# Patient Record
Sex: Male | Born: 1980 | Race: Black or African American | Hispanic: No | Marital: Single | Smoking: Light tobacco smoker
Health system: Southern US, Community
[De-identification: ages and names within clinical notes are randomized; demographics above are authoritative.]

## PROBLEM LIST (undated history)

## (undated) DIAGNOSIS — I10 Essential (primary) hypertension: Secondary | ICD-10-CM

## (undated) DIAGNOSIS — I4891 Unspecified atrial fibrillation: Secondary | ICD-10-CM

## (undated) HISTORY — DX: Essential (primary) hypertension: I10

## (undated) HISTORY — PX: CARDIAC SURGERY: SHX584

---

## 2011-09-17 ENCOUNTER — Emergency Department (HOSPITAL_COMMUNITY): Payer: No Typology Code available for payment source

## 2011-09-17 ENCOUNTER — Emergency Department (HOSPITAL_COMMUNITY)
Admission: EM | Admit: 2011-09-17 | Discharge: 2011-09-17 | Disposition: A | Discharge status: Home / Self Care | Payer: No Typology Code available for payment source | Attending: Emergency Medicine | Admitting: Emergency Medicine

## 2011-09-17 ENCOUNTER — Encounter (HOSPITAL_COMMUNITY): Payer: Self-pay | Admitting: *Deleted

## 2011-09-17 DIAGNOSIS — M545 Low back pain, unspecified: Secondary | ICD-10-CM | POA: Insufficient documentation

## 2011-09-17 DIAGNOSIS — S46909A Unspecified injury of unspecified muscle, fascia and tendon at shoulder and upper arm level, unspecified arm, initial encounter: Secondary | ICD-10-CM | POA: Insufficient documentation

## 2011-09-17 DIAGNOSIS — S4980XA Other specified injuries of shoulder and upper arm, unspecified arm, initial encounter: Secondary | ICD-10-CM | POA: Insufficient documentation

## 2011-09-17 DIAGNOSIS — J45909 Unspecified asthma, uncomplicated: Secondary | ICD-10-CM | POA: Insufficient documentation

## 2011-09-17 DIAGNOSIS — M546 Pain in thoracic spine: Secondary | ICD-10-CM | POA: Insufficient documentation

## 2011-09-17 DIAGNOSIS — J343 Hypertrophy of nasal turbinates: Secondary | ICD-10-CM | POA: Insufficient documentation

## 2011-09-17 DIAGNOSIS — M25519 Pain in unspecified shoulder: Secondary | ICD-10-CM | POA: Insufficient documentation

## 2011-09-17 MED ORDER — IBUPROFEN 600 MG PO TABS: 600.0000 mg | ORAL_TABLET | Freq: Four times a day (QID) | ORAL | Status: AC | PRN

## 2011-09-17 MED ORDER — CYCLOBENZAPRINE HCL 10 MG PO TABS: 10.0000 mg | ORAL_TABLET | Freq: Two times a day (BID) | ORAL | Status: AC | PRN

## 2011-09-17 MED ORDER — OXYCODONE-ACETAMINOPHEN 5-325 MG PO TABS
2.0000 | ORAL_TABLET | Freq: Once | ORAL | Status: AC
  Administered 2011-09-17: 2 via ORAL
  Filled 2011-09-17: qty 2

## 2011-09-17 MED ORDER — TRAMADOL HCL 50 MG PO TABS: 50.0000 mg | ORAL_TABLET | Freq: Four times a day (QID) | ORAL | Status: AC | PRN
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
## 2011-09-17 NOTE — ED Notes (Signed)
No answer
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
## 2011-09-18 NOTE — ED Provider Notes (Signed)
Medical screening examination/treatment/procedure(s) were performed by non-physician practitioner and as supervising physician I was immediately available for consultation/collaboration. Christabelle Hanzlik Y.   Gavin Pound. Oletta Lamas, MD 09/18/11 [phone]

## 2013-02-23 ENCOUNTER — Emergency Department (HOSPITAL_BASED_OUTPATIENT_CLINIC_OR_DEPARTMENT_OTHER)
Admission: EM | Admit: 2013-02-23 | Discharge: 2013-02-23 | Disposition: A | Discharge status: Home / Self Care | Payer: Self-pay | Attending: Emergency Medicine | Admitting: Emergency Medicine

## 2013-02-23 ENCOUNTER — Encounter (HOSPITAL_BASED_OUTPATIENT_CLINIC_OR_DEPARTMENT_OTHER): Payer: Self-pay | Admitting: *Deleted

## 2013-02-23 DIAGNOSIS — S39012A Strain of muscle, fascia and tendon of lower back, initial encounter: Secondary | ICD-10-CM

## 2013-02-23 DIAGNOSIS — IMO0002 Reserved for concepts with insufficient information to code with codable children: Secondary | ICD-10-CM | POA: Insufficient documentation

## 2013-02-23 DIAGNOSIS — Y9367 Activity, basketball: Secondary | ICD-10-CM | POA: Insufficient documentation

## 2013-02-23 DIAGNOSIS — J45909 Unspecified asthma, uncomplicated: Secondary | ICD-10-CM | POA: Insufficient documentation

## 2013-02-23 DIAGNOSIS — I10 Essential (primary) hypertension: Secondary | ICD-10-CM | POA: Insufficient documentation

## 2013-02-23 DIAGNOSIS — S335XXA Sprain of ligaments of lumbar spine, initial encounter: Secondary | ICD-10-CM | POA: Insufficient documentation

## 2013-02-23 DIAGNOSIS — Y9289 Other specified places as the place of occurrence of the external cause: Secondary | ICD-10-CM | POA: Insufficient documentation

## 2013-02-23 DIAGNOSIS — F17200 Nicotine dependence, unspecified, uncomplicated: Secondary | ICD-10-CM | POA: Insufficient documentation

## 2013-02-23 HISTORY — DX: Essential (primary) hypertension: I10

## 2013-02-23 MED ORDER — OXYCODONE-ACETAMINOPHEN 5-325 MG PO TABS
1.0000 | ORAL_TABLET | Freq: Four times a day (QID) | ORAL | Status: DC | PRN
Start: 2013-02-23 — End: 2017-05-02

## 2013-02-23 MED ORDER — IBUPROFEN 600 MG PO TABS: 600.0000 mg | ORAL_TABLET | Freq: Four times a day (QID) | ORAL | Status: DC | PRN

## 2013-02-23 MED ORDER — HYDROMORPHONE HCL PF 1 MG/ML IJ SOLN
1.0000 mg | Freq: Once | INTRAMUSCULAR | Status: AC
  Administered 2013-02-23: 1 mg via INTRAVENOUS
  Filled 2013-02-23: qty 1

## 2013-02-23 MED ORDER — DIAZEPAM 5 MG PO TABS: 5.0000 mg | ORAL_TABLET | Freq: Four times a day (QID) | ORAL | Status: DC | PRN

## 2013-02-23 MED ORDER — DIAZEPAM 5 MG/ML IJ SOLN
5.0000 mg | Freq: Once | INTRAMUSCULAR | Status: AC
  Administered 2013-02-23: 14:00:00 via INTRAVENOUS
  Filled 2013-02-23: qty 2

## 2013-02-23 NOTE — ED Notes (Signed)
Per EMS:  Pt from home.  Reports that he was playing basketball yesterday and twisted wrong.  Reports left lower back pain.  Denies fall.  20 LAC, 30mg  ketorlac given en route.

## 2013-02-23 NOTE — ED Notes (Signed)
Patient ambulates around RN station without difficulty. 

## 2013-02-23 NOTE — ED Provider Notes (Signed)
History    CSN: [id number] Arrival date & time 02/23/13  1041  First MD Initiated Contact with Patient 02/23/13 1126     Chief Complaint  Patient presents with  . Back Pain   (Consider location/radiation/quality/duration/timing/severity/associated sxs/prior Treatment) Patient is a 32 y.o. male presenting with back pain. The history is provided by the patient.  Back Pain Associated symptoms: no abdominal pain, no chest pain, no headaches, no numbness and no weakness    patient was playing basketball yesterday had acute onset of lower back pain. He was able lower himself to the ground. No trauma from a fall. He has had persistent lower back pain since. No weakness. Pain unrelieved with Motrin at home. No numbness weakness. No dysuria. No loss of bladder or bowel control. He does not have a history of back pain. Past Medical History  Diagnosis Date  . Asthma   . Hypertension    History reviewed. No pertinent past surgical history. History reviewed. No pertinent family history. History  Substance Use Topics  . Smoking status: Current Every Day Smoker -- 1.00 packs/day    Types: Cigarettes  . Smokeless tobacco: Not on file  . Alcohol Use: No    Review of Systems  Constitutional: Negative for activity change and appetite change.  HENT: Negative for neck stiffness.   Respiratory: Negative for chest tightness and shortness of breath.   Cardiovascular: Negative for chest pain and leg swelling.  Gastrointestinal: Negative for nausea, vomiting, abdominal pain and diarrhea.  Endocrine: Negative for polydipsia and polyuria.  Genitourinary: Negative for flank pain.  Musculoskeletal: Positive for back pain. Negative for joint swelling.  Skin: Negative for rash.  Neurological: Negative for weakness, numbness and headaches.    Allergies  Review of patient's allergies indicates no known allergies.  Home Medications   Current Outpatient Rx  Name  Route  Sig  Dispense  Refill  .  diazepam (VALIUM) 5 MG tablet   Oral   Take 1 tablet (5 mg total) by mouth every 6 (six) hours as needed for anxiety.   10 tablet   0   . ibuprofen (ADVIL,MOTRIN) 600 MG tablet   Oral   Take 1 tablet (600 mg total) by mouth every 6 (six) hours as needed for pain.   30 tablet   0   . lisinopril (PRINIVIL,ZESTRIL) 5 MG tablet   Oral   Take 5 mg by mouth daily.         Marland Kitchen oxyCODONE-acetaminophen (PERCOCET/ROXICET) 5-325 MG per tablet   Oral   Take 1-2 tablets by mouth every 6 (six) hours as needed for pain.   20 tablet   0    BP 127/64  Pulse 60  Temp(Src) 98.2 F (36.8 C) (Oral)  Resp 20  Ht 6\' 4"  (1.93 m)  Wt 260 lb (117.935 kg)  BMI 31.66 kg/m2  SpO2 100% Physical Exam  Nursing note and vitals reviewed. Constitutional: He is oriented to person, place, and time. He appears well-developed and well-nourished.  Patient is lying in bed and appears uncomfortable  HENT:  Head: Normocephalic and atraumatic.  Eyes: EOM are normal. Pupils are equal, round, and reactive to light.  Neck: Normal range of motion. Neck supple.  Cardiovascular: Normal rate, regular rhythm and normal heart sounds.   No murmur heard. Pulmonary/Chest: Effort normal and breath sounds normal.  Abdominal: Soft. Bowel sounds are normal. He exhibits no distension and no mass. There is no tenderness. There is no rebound and no guarding.  Musculoskeletal:  He exhibits tenderness. He exhibits no edema.  Left lumbar paraspinal tenderness. No midline tenderness. No rash.  Neurological: He is alert and oriented to person, place, and time. No cranial nerve deficit.  Skin: Skin is warm and dry.  Psychiatric: He has a normal mood and affect.    ED Course  Procedures (including critical care time) Labs Reviewed - No data to display No results found. 1. Lumbar strain, initial encounter     MDM  Patient with back pain. Not radicular sounding. Patient feels better after treatment will be discharged home. No red  flags.  Juliet Rude. Rubin Payor, MD 02/23/13 [phone]

## 2013-02-23 NOTE — ED Notes (Signed)
Pt sleeping with deep, snoring resp. Pt aroused by his gf at bedside, states "wake up, your pain medicine is here!" pt arouses, speech slurred, states "good. About time." and goes back to sleep with deep snoring resp.

## 2014-07-07 ENCOUNTER — Emergency Department (HOSPITAL_COMMUNITY)
Admission: EM | Admit: 2014-07-07 | Discharge: 2014-07-07 | Disposition: A | Discharge status: Home / Self Care | Payer: No Typology Code available for payment source | Attending: Emergency Medicine | Admitting: Emergency Medicine

## 2014-07-07 ENCOUNTER — Encounter (HOSPITAL_COMMUNITY): Payer: Self-pay | Admitting: Emergency Medicine

## 2014-07-07 ENCOUNTER — Emergency Department (HOSPITAL_COMMUNITY): Payer: No Typology Code available for payment source

## 2014-07-07 DIAGNOSIS — R079 Chest pain, unspecified: Secondary | ICD-10-CM

## 2014-07-07 DIAGNOSIS — J189 Pneumonia, unspecified organism: Secondary | ICD-10-CM

## 2014-07-07 DIAGNOSIS — Z79899 Other long term (current) drug therapy: Secondary | ICD-10-CM | POA: Insufficient documentation

## 2014-07-07 DIAGNOSIS — J159 Unspecified bacterial pneumonia: Secondary | ICD-10-CM | POA: Insufficient documentation

## 2014-07-07 DIAGNOSIS — H6502 Acute serous otitis media, left ear: Secondary | ICD-10-CM

## 2014-07-07 DIAGNOSIS — Z720 Tobacco use: Secondary | ICD-10-CM | POA: Insufficient documentation

## 2014-07-07 DIAGNOSIS — J45909 Unspecified asthma, uncomplicated: Secondary | ICD-10-CM | POA: Insufficient documentation

## 2014-07-07 DIAGNOSIS — I4891 Unspecified atrial fibrillation: Secondary | ICD-10-CM | POA: Insufficient documentation

## 2014-07-07 DIAGNOSIS — I10 Essential (primary) hypertension: Secondary | ICD-10-CM | POA: Insufficient documentation

## 2014-07-07 HISTORY — DX: Unspecified atrial fibrillation: I48.91

## 2014-07-07 LAB — CBC
HCT: 44.2 % (ref 39.0–52.0)
Hemoglobin: 15.3 g/dL (ref 13.0–17.0)
MCH: 28.6 pg (ref 26.0–34.0)
MCHC: 34.6 g/dL (ref 30.0–36.0)
MCV: 82.6 fL (ref 78.0–100.0)
PLATELETS: 289 10*3/uL (ref 150–400)
RBC: 5.35 MIL/uL (ref 4.22–5.81)
RDW: 13.8 % (ref 11.5–15.5)
WBC: 10.3 10*3/uL (ref 4.0–10.5)

## 2014-07-07 LAB — I-STAT TROPONIN, ED: Troponin i, poc: 0 ng/mL (ref 0.00–0.08)

## 2014-07-07 LAB — BASIC METABOLIC PANEL
ANION GAP: 19 — AB (ref 5–15)
BUN: 8 mg/dL (ref 6–23)
CALCIUM: 9.8 mg/dL (ref 8.4–10.5)
CHLORIDE: 92 meq/L — AB (ref 96–112)
CO2: 24 mEq/L (ref 19–32)
CREATININE: 1.12 mg/dL (ref 0.50–1.35)
GFR calc non Af Amer: 86 mL/min — ABNORMAL LOW (ref >90)
Glucose, Bld: 91 mg/dL (ref 70–99)
Potassium: 3.5 mEq/L — ABNORMAL LOW (ref 3.7–5.3)
Sodium: 135 mEq/L — ABNORMAL LOW (ref 137–147)

## 2014-07-07 LAB — PRO B NATRIURETIC PEPTIDE: Pro B Natriuretic peptide (BNP): <5 pg/mL (ref 0–125)

## 2014-07-07 MED ORDER — AMOXICILLIN-POT CLAVULANATE 875-125 MG PO TABS
1.0000 | ORAL_TABLET | Freq: Once | ORAL | Status: AC
  Administered 2014-07-07: 1 via ORAL
  Filled 2014-07-07: qty 1

## 2014-07-07 MED ORDER — AMOXICILLIN-POT CLAVULANATE 875-125 MG PO TABS: 1.0000 | ORAL_TABLET | Freq: Once | ORAL | Status: DC

## 2014-07-07 MED ORDER — ACETAMINOPHEN 325 MG PO TABS
325.0000 mg | ORAL_TABLET | Freq: Once | ORAL | Status: AC
  Administered 2014-07-07: 325 mg via ORAL
  Filled 2014-07-07: qty 1

## 2014-07-07 MED ORDER — AZITHROMYCIN 250 MG PO TABS: 250.0000 mg | ORAL_TABLET | Freq: Every day | ORAL | Status: DC

## 2014-07-07 MED ORDER — AZITHROMYCIN 250 MG PO TABS
500.0000 mg | ORAL_TABLET | Freq: Once | ORAL | Status: AC
  Administered 2014-07-07: 500 mg via ORAL
  Filled 2014-07-07: qty 2

## 2014-07-07 NOTE — ED Notes (Signed)
Pt. reports mid chest pain with SOB onset 4 days ago , denies nausea or diaphoresis .

## 2014-07-07 NOTE — ED Provider Notes (Signed)
CSN: [id number]     Arrival date & time 07/07/14  0144 History   First MD Initiated Contact with Patient 07/07/14 0207     Chief Complaint  Patient presents with  . Chest Pain     (Consider location/radiation/quality/duration/timing/severity/associated sxs/prior Treatment) HPI 33 year old male presents to the emergency department with complaint of shortness of breath, cough, chest pain, body aches, left ear pain.  Patient is a smoker.  Chest pain is diffuse, slightly worse on the right side.  It is worse with taking a deep breath. Past Medical History  Diagnosis Date  . Asthma   . Hypertension   . Atrial fibrillation    History reviewed. No pertinent past surgical history. No family history on file. History  Substance Use Topics  . Smoking status: Current Every Day Smoker -- 1.00 packs/day    Types: Cigarettes  . Smokeless tobacco: Not on file  . Alcohol Use: No    Review of Systems   See History of Present Illness; otherwise all other systems are reviewed and negative  Allergies  Review of patient's allergies indicates no known allergies.  Home Medications   Prior to Admission medications   Medication Sig Start Date End Date Taking? Authorizing Provider  diazepam (VALIUM) 5 MG tablet Take 1 tablet (5 mg total) by mouth every 6 (six) hours as needed for anxiety. 02/23/13   Juliet RudeNathan R. Pickering, MD  ibuprofen (ADVIL,MOTRIN) 600 MG tablet Take 1 tablet (600 mg total) by mouth every 6 (six) hours as needed for pain. 02/23/13   Juliet RudeNathan R. Pickering, MD  lisinopril (PRINIVIL,ZESTRIL) 5 MG tablet Take 5 mg by mouth daily.    Historical Provider, MD  oxyCODONE-acetaminophen (PERCOCET/ROXICET) 5-325 MG per tablet Take 1-2 tablets by mouth every 6 (six) hours as needed for pain. 02/23/13   Juliet RudeNathan R. Pickering, MD   BP 139/73 mmHg  Pulse 93  Temp(Src) 102.8 F (39.3 C) (Oral)  Resp 12  SpO2 99% Physical Exam  Constitutional: He is oriented to person, place, and time. He appears  well-developed and well-nourished.  HENT:  Head: Normocephalic and atraumatic.  Right Ear: External ear normal.  Nose: Nose normal.  Mouth/Throat: Oropharynx is clear and moist.  Patient has effusion noted behind left TM  Eyes: Conjunctivae and EOM are normal. Pupils are equal, round, and reactive to light.  Neck: Normal range of motion. Neck supple. No JVD present. No tracheal deviation present. No thyromegaly present.  Cardiovascular: Normal rate, regular rhythm, normal heart sounds and intact distal pulses.  Exam reveals no gallop and no friction rub.   No murmur heard. Pulmonary/Chest: Effort normal. No stridor. No respiratory distress. He has no wheezes. He has no rales. He exhibits no tenderness.  Mild rhonchi in right lower lung fields  Abdominal: Soft. Bowel sounds are normal. He exhibits no distension and no mass. There is no tenderness. There is no rebound and no guarding.  Musculoskeletal: Normal range of motion. He exhibits no edema or tenderness.  Lymphadenopathy:    He has no cervical adenopathy.  Neurological: He is alert and oriented to person, place, and time. He displays normal reflexes. He exhibits normal muscle tone. Coordination normal.  Skin: Skin is warm and dry. No rash noted. No erythema. No pallor.  Psychiatric: He has a normal mood and affect. His behavior is normal. Judgment and thought content normal.  Nursing note and vitals reviewed.   ED Course  Procedures (including critical care time) Labs Review Labs Reviewed  CBC  BASIC METABOLIC  PANEL  PRO B NATRIURETIC PEPTIDE  I-STAT TROPOININ, ED    Imaging Review Dg Chest 2 View  07/07/2014   CLINICAL DATA:  Chest pain and fever  EXAM: CHEST  2 VIEW  COMPARISON:  09/17/2011  FINDINGS: The cardiac shadow is within normal limits. The lungs are well aerated bilaterally. Focal infiltrate is noted in the lateral right lung base which projects in the right lower lobe. No other focal infiltrate is seen. The bony  structures are within normal limits.  IMPRESSION: Right lower lobe pneumonia.   Electronically Signed   By: Alcide CleverMark  Lukens M.D.   On: 07/07/2014 02:26     EKG Interpretation   Date/Time:  Thursday July 07 2014 01:51:29 EST Ventricular Rate:  106 PR Interval:  148 QRS Duration: 90 QT Interval:  310 QTC Calculation: 411 R Axis:   87 Text Interpretation:  Sinus tachycardia with occasional Premature  ventricular complexes Right atrial enlargement ST \\T \ T wave abnormality,  consider inferolateral ischemia Abnormal ECG No old tracing to compare  Confirmed by Curley Fayette  MD, Sreekar Broyhill ([id number]) on 07/07/2014 1:53:00 AM      MDM   Final diagnoses:  CAP (community acquired pneumonia)  Acute serous otitis media of left ear, recurrence not specified    Patient with right lower lobe pneumonia, also has a left sided otitis media.  Febrile and tachycardic here, but not toxic appearing.  Will use Zithromax and amoxicillin to treat.  Patient given return precautions.  Instructed to alternate Tylenol and ibuprofen for body aches.    [name], MD 07/07/14 [phone]

## 2014-07-07 NOTE — Discharge Instructions (Signed)
Take antibiotics as prescribed.  Alternate Tylenol and Motrin every 4-6 hours for body aches or fever.  Drink plenty of fluids.  Rest.  Return to the emergency department for worsening condition or new concerning symptoms.   Otitis Media Otitis media is redness, soreness, and inflammation of the middle ear. Otitis media may be caused by allergies or, most commonly, by infection. Often it occurs as a complication of the common cold. SIGNS AND SYMPTOMS Symptoms of otitis media may include:  Earache.  Fever.  Ringing in your ear.  Headache.  Leakage of fluid from the ear. DIAGNOSIS To diagnose otitis media, your health care provider will examine your ear with an otoscope. This is an instrument that allows your health care provider to see into your ear in order to examine your eardrum. Your health care provider also will ask you questions about your symptoms. TREATMENT  Typically, otitis media resolves on its own within 3-5 days. Your health care provider may prescribe medicine to ease your symptoms of pain. If otitis media does not resolve within 5 days or is recurrent, your health care provider may prescribe antibiotic medicines if he or she suspects that a bacterial infection is the cause. HOME CARE INSTRUCTIONS   If you were prescribed an antibiotic medicine, finish it all even if you start to feel better.  Take medicines only as directed by your health care provider.  Keep all follow-up visits as directed by your health care provider. SEEK MEDICAL CARE IF:  You have otitis media only in one ear, or bleeding from your nose, or both.  You notice a lump on your neck.  You are not getting better in 3-5 days.  You feel worse instead of better. SEEK IMMEDIATE MEDICAL CARE IF:   You have pain that is not controlled with medicine.  You have swelling, redness, or pain around your ear or stiffness in your neck.  You notice that part of your face is paralyzed.  You notice that the  bone behind your ear (mastoid) is tender when you touch it. MAKE SURE YOU:   Understand these instructions.  Will watch your condition.  Will get help right away if you are not doing well or get worse. Document Released: 05/10/2004 Document Revised: 12/20/2013 Document Reviewed: 03/02/2013 [facility] CenterExitCare Patient Information 2015 [place], [place]. This information is not intended to replace advice given to you by your health care provider. Make sure you discuss any questions you have with your health care provider.  Pneumonia Pneumonia is an infection of the lungs.  CAUSES Pneumonia may be caused by bacteria or a virus. Usually, these infections are caused by breathing infectious particles into the lungs (respiratory tract). SIGNS AND SYMPTOMS   Cough.  Fever.  Chest pain.  Increased rate of breathing.  Wheezing.  Mucus production. DIAGNOSIS  If you have the common symptoms of pneumonia, your health care provider will typically confirm the diagnosis with a chest X-ray. The X-ray will show an abnormality in the lung (pulmonary infiltrate) if you have pneumonia. Other tests of your blood, urine, or sputum may be done to find the specific cause of your pneumonia. Your health care provider may also do tests (blood gases or pulse oximetry) to see how well your lungs are working. TREATMENT  Some forms of pneumonia may be spread to other people when you cough or sneeze. You may be asked to wear a mask before and during your exam. Pneumonia that is caused by bacteria is treated with antibiotic medicine.  Pneumonia that is caused by the influenza virus may be treated with an antiviral medicine. Most other viral infections must run their course. These infections will not respond to antibiotics.  HOME CARE INSTRUCTIONS   Cough suppressants may be used if you are losing too much rest. However, coughing protects you by clearing your lungs. You should avoid using cough suppressants if you can.  Your  health care provider may have prescribed medicine if he or she thinks your pneumonia is caused by bacteria or influenza. Finish your medicine even if you start to feel better.  Your health care provider may also prescribe an expectorant. This loosens the mucus to be coughed up.  Take medicines only as directed by your health care provider.  Do not smoke. Smoking is a common cause of bronchitis and can contribute to pneumonia. If you are a smoker and continue to smoke, your cough may last several weeks after your pneumonia has cleared.  A cold steam vaporizer or humidifier in your room or home may help loosen mucus.  Coughing is often worse at night. Sleeping in a semi-upright position in a recliner or using a couple pillows under your head will help with this.  Get rest as you feel it is needed. Your body will usually let you know when you need to rest. PREVENTION A pneumococcal shot (vaccine) is available to prevent a common bacterial cause of pneumonia. This is usually suggested for:  People over 31 years old.  Patients on chemotherapy.  People with chronic lung problems, such as bronchitis or emphysema.  People with immune system problems. If you are over 65 or have a high risk condition, you may receive the pneumococcal vaccine if you have not received it before. In some countries, a routine influenza vaccine is also recommended. This vaccine can help prevent some cases of pneumonia.You may be offered the influenza vaccine as part of your care. If you smoke, it is time to quit. You may receive instructions on how to stop smoking. Your health care provider can provide medicines and counseling to help you quit. SEEK MEDICAL CARE IF: You have a fever. SEEK IMMEDIATE MEDICAL CARE IF:   Your illness becomes worse. This is especially true if you are elderly or weakened from any other disease.  You cannot control your cough with suppressants and are losing sleep.  You begin coughing up  blood.  You develop pain which is getting worse or is uncontrolled with medicines.  Any of the symptoms which initially brought you in for treatment are getting worse rather than better.  You develop shortness of breath or chest pain. MAKE SURE YOU:   Understand these instructions.  Will watch your condition.  Will get help right away if you are not doing well or get worse. Document Released: 08/05/2005 Document Revised: 12/20/2013 Document Reviewed: 10/25/2010 [facility]ExitCare Patient Information 2015 [place], [place]. This information is not intended to replace advice given to you by your health care provider. Make sure you discuss any questions you have with your health care provider.

## 2017-03-23 ENCOUNTER — Encounter (HOSPITAL_COMMUNITY): Payer: Self-pay | Admitting: *Deleted

## 2017-03-23 ENCOUNTER — Ambulatory Visit (HOSPITAL_COMMUNITY)
Admission: EM | Admit: 2017-03-23 | Discharge: 2017-03-23 | Disposition: A | Discharge status: Home / Self Care | Payer: Medicaid Other | Attending: Emergency Medicine | Admitting: Emergency Medicine

## 2017-03-23 DIAGNOSIS — J339 Nasal polyp, unspecified: Secondary | ICD-10-CM

## 2017-03-23 DIAGNOSIS — R300 Dysuria: Secondary | ICD-10-CM | POA: Diagnosis present

## 2017-03-23 DIAGNOSIS — J069 Acute upper respiratory infection, unspecified: Secondary | ICD-10-CM | POA: Diagnosis not present

## 2017-03-23 DIAGNOSIS — J029 Acute pharyngitis, unspecified: Secondary | ICD-10-CM | POA: Insufficient documentation

## 2017-03-23 DIAGNOSIS — M791 Myalgia: Secondary | ICD-10-CM | POA: Diagnosis not present

## 2017-03-23 LAB — POCT URINALYSIS DIP (DEVICE)
Bilirubin Urine: NEGATIVE
Glucose, UA: NEGATIVE mg/dL
Hgb urine dipstick: NEGATIVE
KETONES UR: NEGATIVE mg/dL
Leukocytes, UA: NEGATIVE
Nitrite: NEGATIVE
PH: 6 (ref 5.0–8.0)
PROTEIN: NEGATIVE mg/dL
SPECIFIC GRAVITY, URINE: 1.025 (ref 1.005–1.030)
UROBILINOGEN UA: 0.2 mg/dL (ref 0.0–1.0)

## 2017-03-23 MED ORDER — IPRATROPIUM BROMIDE 0.06 % NA SOLN: 2.0000 | Freq: Four times a day (QID) | NASAL | 0 refills | Status: DC

## 2017-03-23 NOTE — ED Provider Notes (Signed)
  [facility]   [id number] 03/23/17 Arrival Time: 1652  ASSESSMENT & PLAN:  1. Viral upper respiratory tract infection   2. Nasal polyp   3. Dysuria     Meds ordered this encounter  Medications  . ipratropium (ATROVENT) 0.06 % nasal spray    Sig: Place 2 sprays into both nostrils 4 (four) times daily.    Dispense:  15 mL    Refill:  0    Order Specific Question:   Supervising Provider    Answer:   Eustace MooreMURRAY, LAURA W [[id number]]    Reviewed expectations re: course of current medical issues. Questions answered. Outlined signs and symptoms indicating need for more acute intervention. Patient verbalized understanding. After Visit Summary given.   SUBJECTIVE:  Christian Schroeder is a 36 y.o. male who presents with complaint of sinus pain and pressure, rhinorrhea, headache, and fatigue. Also complains of sore throat, malaise, and myalgia. For 3 days. He also is complaining of some slight dysuria, and a 2 week history of left-sided flank pain. Denies any fever or chills, nausea, vomiting, or diarrhea. Denies exposure to ticks, however he does have a area of pruritic rash left calf muscle.  ROS: As per HPI, otherwise negative.   OBJECTIVE:  Vitals:   03/23/17 1721  BP: 140/80  Pulse: 92  Resp: 18  Temp: 98.6 F (37 C)  TempSrc: Oral  SpO2: 100%     General appearance: alert; no distress HEENT: normocephalic; atraumatic; conjunctivae normal; TMs normal; nasal mucosa normal, Possible nasal polyp right naris; oral mucosa normal, no oropharyngeal erythema, edema, or tonsillar exudate Neck: supple, no cervical lymphadenopathy Lungs: clear to auscultation bilaterally Heart: regular rate and rhythm Abdomen: soft, non-tender; bowel sounds normal; no masses or organomegaly; no guarding or rebound tenderness Back: no CVA tenderness Extremities: no cyanosis or edema; symmetrical with no gross deformities Skin: warm and dry Neurologic: Grossly normal Psychological:  alert and  cooperative; normal mood and affect  Results for orders placed or performed during the hospital encounter of 03/23/17  POCT urinalysis dip (device)  Result Value Ref Range   Glucose, UA NEGATIVE NEGATIVE mg/dL   Bilirubin Urine NEGATIVE NEGATIVE   Ketones, ur NEGATIVE NEGATIVE mg/dL   Specific Gravity, Urine 1.025 1.005 - 1.030   Hgb urine dipstick NEGATIVE NEGATIVE   pH 6.0 5.0 - 8.0   Protein, ur NEGATIVE NEGATIVE mg/dL   Urobilinogen, UA 0.2 0.0 - 1.0 mg/dL   Nitrite NEGATIVE NEGATIVE   Leukocytes, UA NEGATIVE NEGATIVE    Labs Reviewed  POCT URINALYSIS DIP (DEVICE)  URINE CYTOLOGY ANCILLARY ONLY    No results found.  No Known Allergies  PMHx, SurgHx, SocialHx, Medications, and Allergies were reviewed in the Visit Navigator and updated as appropriate.      Christian Schroeder, Christian Rottenberg, NP 03/23/17 1930

## 2017-03-23 NOTE — Discharge Instructions (Signed)
You most likely have a viral URI, this type of infection will not be helped by antibiotics.  For your symptoms I have prescribed ipratropium nasal spray for rhinorrhea.   In addition to these therapies, I advise rest, plenty of fluids and management of symptoms with over the counter medicines. Over the counter therapies for your symptoms include:Tylenol as needed every 4-6 hours for body aches or fever, not to exceed 4,000 mg a day, Take mucinex or mucinex DM ever 12 hours with a full glass of water, you may use an inhaled steroid such as Flonase, 2 sprays each nostril once a day for congestion, or an antihistamine such as Claritin or Zyrtec once a day. Another alternative for congestion, is a pseudoephedrine containing product available from the pharmacist. Should your symptoms worsen or fail to resolve, follow up with your primary care provider or return to clinic.

## 2017-03-23 NOTE — ED Triage Notes (Signed)
Pt  Reports    Symptoms  Of    Nasal   Congestion   Sinus   Drainage     Runny  Nose         And       Heart   Racing  Earlier         The  Patient  Is   Sitting  Upright  On  The   Exam  Table          Speaking  In  Complete   sentances

## 2017-03-24 LAB — URINE CYTOLOGY ANCILLARY ONLY
CHLAMYDIA, DNA PROBE: NEGATIVE
NEISSERIA GONORRHEA: NEGATIVE
TRICH (WINDOWPATH): NEGATIVE

## 2017-04-29 ENCOUNTER — Encounter (HOSPITAL_COMMUNITY): Payer: Self-pay | Admitting: Emergency Medicine

## 2017-04-29 ENCOUNTER — Other Ambulatory Visit: Payer: Self-pay

## 2017-04-29 ENCOUNTER — Emergency Department (HOSPITAL_COMMUNITY): Payer: Medicaid Other

## 2017-04-29 DIAGNOSIS — R110 Nausea: Secondary | ICD-10-CM

## 2017-04-29 DIAGNOSIS — R42 Dizziness and giddiness: Secondary | ICD-10-CM

## 2017-04-29 DIAGNOSIS — F17210 Nicotine dependence, cigarettes, uncomplicated: Secondary | ICD-10-CM | POA: Diagnosis not present

## 2017-04-29 DIAGNOSIS — Z79899 Other long term (current) drug therapy: Secondary | ICD-10-CM

## 2017-04-29 DIAGNOSIS — R079 Chest pain, unspecified: Secondary | ICD-10-CM | POA: Diagnosis present

## 2017-04-29 DIAGNOSIS — R0602 Shortness of breath: Secondary | ICD-10-CM | POA: Diagnosis not present

## 2017-04-29 DIAGNOSIS — J45909 Unspecified asthma, uncomplicated: Secondary | ICD-10-CM | POA: Diagnosis not present

## 2017-04-29 DIAGNOSIS — R0789 Other chest pain: Secondary | ICD-10-CM | POA: Insufficient documentation

## 2017-04-29 DIAGNOSIS — I4891 Unspecified atrial fibrillation: Secondary | ICD-10-CM | POA: Diagnosis not present

## 2017-04-29 DIAGNOSIS — Z91040 Latex allergy status: Secondary | ICD-10-CM | POA: Diagnosis not present

## 2017-04-29 DIAGNOSIS — R1010 Upper abdominal pain, unspecified: Secondary | ICD-10-CM | POA: Insufficient documentation

## 2017-04-29 DIAGNOSIS — I10 Essential (primary) hypertension: Secondary | ICD-10-CM

## 2017-04-29 LAB — BASIC METABOLIC PANEL
ANION GAP: 9 (ref 5–15)
BUN: 11 mg/dL (ref 6–20)
CALCIUM: 10.1 mg/dL (ref 8.9–10.3)
CO2: 26 mmol/L (ref 22–32)
Chloride: 103 mmol/L (ref 101–111)
Creatinine, Ser: 1.13 mg/dL (ref 0.61–1.24)
GLUCOSE: 123 mg/dL — AB (ref 65–99)
POTASSIUM: 3.2 mmol/L — AB (ref 3.5–5.1)
Sodium: 138 mmol/L (ref 135–145)

## 2017-04-29 LAB — I-STAT TROPONIN, ED: TROPONIN I, POC: 0.01 ng/mL (ref 0.00–0.08)

## 2017-04-29 LAB — CBC
HEMATOCRIT: 47.2 % (ref 39.0–52.0)
HEMOGLOBIN: 15.3 g/dL (ref 13.0–17.0)
MCH: 27.2 pg (ref 26.0–34.0)
MCHC: 32.4 g/dL (ref 30.0–36.0)
MCV: 83.8 fL (ref 78.0–100.0)
Platelets: 304 10*3/uL (ref 150–400)
RBC: 5.63 MIL/uL (ref 4.22–5.81)
RDW: 14.4 % (ref 11.5–15.5)
WBC: 5.9 10*3/uL (ref 4.0–10.5)

## 2017-04-29 NOTE — ED Triage Notes (Signed)
Pt. Stated, I've had some acid reflux, with some lightheadedness. And today I've had chest pain.

## 2017-04-30 ENCOUNTER — Other Ambulatory Visit: Payer: Self-pay

## 2017-04-30 ENCOUNTER — Emergency Department (HOSPITAL_COMMUNITY)
Admission: EM | Admit: 2017-04-30 | Discharge: 2017-04-30 | Disposition: A | Discharge status: Home / Self Care | Payer: Medicaid Other | Source: Home / Self Care | Attending: Emergency Medicine | Admitting: Emergency Medicine

## 2017-04-30 ENCOUNTER — Emergency Department (HOSPITAL_COMMUNITY)
Admission: EM | Admit: 2017-04-30 | Discharge: 2017-04-30 | Disposition: A | Discharge status: Home / Self Care | Payer: Medicaid Other | Attending: Emergency Medicine | Admitting: Emergency Medicine

## 2017-04-30 ENCOUNTER — Encounter (HOSPITAL_COMMUNITY): Payer: Self-pay | Admitting: Emergency Medicine

## 2017-04-30 DIAGNOSIS — R0602 Shortness of breath: Secondary | ICD-10-CM | POA: Insufficient documentation

## 2017-04-30 DIAGNOSIS — I10 Essential (primary) hypertension: Secondary | ICD-10-CM | POA: Insufficient documentation

## 2017-04-30 DIAGNOSIS — F17210 Nicotine dependence, cigarettes, uncomplicated: Secondary | ICD-10-CM | POA: Insufficient documentation

## 2017-04-30 DIAGNOSIS — I4891 Unspecified atrial fibrillation: Secondary | ICD-10-CM

## 2017-04-30 DIAGNOSIS — R0789 Other chest pain: Secondary | ICD-10-CM

## 2017-04-30 DIAGNOSIS — Z91040 Latex allergy status: Secondary | ICD-10-CM | POA: Insufficient documentation

## 2017-04-30 DIAGNOSIS — J45909 Unspecified asthma, uncomplicated: Secondary | ICD-10-CM | POA: Insufficient documentation

## 2017-04-30 DIAGNOSIS — Z79899 Other long term (current) drug therapy: Secondary | ICD-10-CM | POA: Insufficient documentation

## 2017-04-30 LAB — BASIC METABOLIC PANEL
Anion gap: 9 (ref 5–15)
BUN: 9 mg/dL (ref 6–20)
CALCIUM: 9.9 mg/dL (ref 8.9–10.3)
CO2: 28 mmol/L (ref 22–32)
Chloride: 101 mmol/L (ref 101–111)
Creatinine, Ser: 1.1 mg/dL (ref 0.61–1.24)
GFR calc Af Amer: >60 mL/min (ref >60)
GLUCOSE: 102 mg/dL — AB (ref 65–99)
Potassium: 3.2 mmol/L — ABNORMAL LOW (ref 3.5–5.1)
Sodium: 138 mmol/L (ref 135–145)

## 2017-04-30 LAB — CBC
HEMATOCRIT: 46.1 % (ref 39.0–52.0)
Hemoglobin: 15.1 g/dL (ref 13.0–17.0)
MCH: 27.3 pg (ref 26.0–34.0)
MCHC: 32.8 g/dL (ref 30.0–36.0)
MCV: 83.2 fL (ref 78.0–100.0)
Platelets: 312 10*3/uL (ref 150–400)
RBC: 5.54 MIL/uL (ref 4.22–5.81)
RDW: 14.2 % (ref 11.5–15.5)
WBC: 6.9 10*3/uL (ref 4.0–10.5)

## 2017-04-30 LAB — MAGNESIUM: MAGNESIUM: 2 mg/dL (ref 1.7–2.4)

## 2017-04-30 LAB — TROPONIN I

## 2017-04-30 MED ORDER — DILTIAZEM HCL 25 MG/5ML IV SOLN
20.0000 mg | Freq: Once | INTRAVENOUS | Status: AC
  Administered 2017-04-30: 20 mg via INTRAVENOUS
  Filled 2017-04-30: qty 5

## 2017-04-30 MED ORDER — APIXABAN 5 MG PO TABS: 5.0000 mg | ORAL_TABLET | Freq: Once | ORAL | Status: DC

## 2017-04-30 MED ORDER — RIVAROXABAN 20 MG PO TABS: 20.0000 mg | ORAL_TABLET | Freq: Every day | ORAL | Status: DC

## 2017-04-30 MED ORDER — DILTIAZEM HCL ER COATED BEADS 120 MG PO CP24: 120.0000 mg | ORAL_CAPSULE | Freq: Every day | ORAL | 0 refills | Status: DC

## 2017-04-30 MED ORDER — APIXABAN 5 MG PO TABS
5.0000 mg | ORAL_TABLET | Freq: Two times a day (BID) | ORAL | Status: DC
  Administered 2017-04-30: 5 mg via ORAL
  Filled 2017-04-30: qty 1

## 2017-04-30 MED ORDER — PROPOFOL 10 MG/ML IV BOLUS
0.5000 mg/kg | Freq: Once | INTRAVENOUS | Status: AC
  Administered 2017-04-30: 60.1 mg via INTRAVENOUS
  Filled 2017-04-30: qty 20

## 2017-04-30 MED ORDER — OMEPRAZOLE 20 MG PO CPDR: 20.0000 mg | DELAYED_RELEASE_CAPSULE | Freq: Two times a day (BID) | ORAL | 0 refills | Status: DC

## 2017-04-30 MED ORDER — RIVAROXABAN (XARELTO) EDUCATION KIT FOR AFIB PATIENTS: PACK | Freq: Once | Status: DC

## 2017-04-30 MED ORDER — DILTIAZEM HCL 100 MG IV SOLR
5.0000 mg/h | Freq: Once | INTRAVENOUS | Status: AC
  Administered 2017-04-30: 5 mg/h via INTRAVENOUS
  Filled 2017-04-30: qty 100

## 2017-04-30 MED ORDER — PROPOFOL 10 MG/ML IV BOLUS
INTRAVENOUS | Status: AC | PRN
  Administered 2017-04-30: 20 mg via INTRAVENOUS

## 2017-04-30 NOTE — Sedation Documentation (Signed)
Cardioverted 120 Jules  Pt converted to sinus rhythm

## 2017-04-30 NOTE — ED Triage Notes (Signed)
Pt states he feels lightheaded and dizzy as well

## 2017-04-30 NOTE — Discharge Instructions (Signed)
Prilosec as prescribed.  Follow-up with a primary Dr. if not improving in the next week.

## 2017-04-30 NOTE — ED Notes (Signed)
Pt stable, ambulatory, states understanding of discharge instructions 

## 2017-04-30 NOTE — ED Triage Notes (Addendum)
Pt states he woke up with CP today and nausea today. Pt very emotional. Pt states he can hardly breathe.  Pt appears very anxious. Pt states he was seen  Yesterday for cp and sent home.

## 2017-04-30 NOTE — ED Notes (Signed)
ED Provider at bedside. 

## 2017-04-30 NOTE — ED Provider Notes (Signed)
[facility] Provider Note   CSN: [id number] Arrival date & time: 04/30/17  1232     History   Chief Complaint Chief Complaint  Patient presents with  . Chest Pain    HPI Elisha Cooksey is a 36 y.o. male.  Patient is a 36 year old male with a prior history of atrial fibrillation with ablation when he was 36 years old as well as hypertension and asthma presenting today with chest pain, shortness of breath and palpitations.  Patient states he was seen yesterday for similar but was told he was okay was discharged home. However he states after going home he was not feeling any better. He returned because symptoms were worsening. Upon arrival here patient was found to be extremely tachycardic and was brought to the back immediately. He has vague symptoms of not feeling well for several weeks. He describes coughing, mucus, fatigue, intermittent palpitations.  He denies fever but states last week tried to use his brother's inhaler which did not help. He also over the last 4 days has been taking a quarter of a blood pressure pill of his Dad's because he thought his blood pressure may be elevated because he had a headache.  He denies any cocaine or stimulant use. He is not taking any medications currently and states after his ablation he was eventually taken off all meds and cleared to return only if necessary.   The history is provided by the patient.    Past Medical History:  Diagnosis Date  . Asthma   . Atrial fibrillation (HCC)   . Hypertension     There are no active problems to display for this patient.   Past Surgical History:  Procedure Laterality Date  . CARDIAC SURGERY         Home Medications    Prior to Admission medications   Medication Sig Start Date End Date Taking? Authorizing Provider  amoxicillin-clavulanate (AUGMENTIN) 875-125 MG per tablet Take 1 tablet by mouth once. 07/07/14   Marisa Severin, MD  azithromycin (ZITHROMAX) 250 MG tablet Take 1 tablet (250  mg total) by mouth daily. 07/07/14   Marisa Severin, MD  diazepam (VALIUM) 5 MG tablet Take 1 tablet (5 mg total) by mouth every 6 (six) hours as needed for anxiety. 02/23/13   Benjiman Core, MD  ibuprofen (ADVIL,MOTRIN) 600 MG tablet Take 1 tablet (600 mg total) by mouth every 6 (six) hours as needed for pain. 02/23/13   Benjiman Core, MD  ipratropium (ATROVENT) 0.06 % nasal spray Place 2 sprays into both nostrils 4 (four) times daily. 03/23/17   Dorena Bodo, NP  lisinopril (PRINIVIL,ZESTRIL) 5 MG tablet Take 5 mg by mouth daily.    [provider]  omeprazole (PRILOSEC) 20 MG capsule Take 1 capsule (20 mg total) by mouth 2 (two) times daily before a meal. 04/30/17   Geoffery Lyons, MD  oxyCODONE-acetaminophen (PERCOCET/ROXICET) 5-325 MG per tablet Take 1-2 tablets by mouth every 6 (six) hours as needed for pain. 02/23/13   Benjiman Core, MD    Family History History reviewed. No pertinent family history.  Social History Social History  Substance Use Topics  . Smoking status: Current Every Day Smoker    Packs/day: 1.00    Types: Cigarettes  . Smokeless tobacco: Current User  . Alcohol use No     Allergies   Latex   Review of Systems Review of Systems  All other systems reviewed and are negative.    Physical Exam Updated Vital Signs BP (!) 178/160  Pulse 93   Temp (!) 97.5 F (36.4 C) (Oral)   Resp 14   SpO2 100%   Physical Exam  Constitutional: He is oriented to person, place, and time. He appears well-developed and well-nourished.  Anxious and tearful  HENT:  Head: Normocephalic and atraumatic.  Mouth/Throat: Oropharynx is clear and moist.  Eyes: Pupils are equal, round, and reactive to light. Conjunctivae and EOM are normal.  Neck: Normal range of motion. Neck supple.  Cardiovascular: Intact distal pulses.  An irregularly irregular rhythm present. Tachycardia present.   No murmur heard. Pulmonary/Chest: Effort normal and breath sounds normal. No  respiratory distress. He has no wheezes. He has no rales.  Abdominal: Soft. He exhibits no distension. There is no tenderness. There is no rebound and no guarding.  Musculoskeletal: Normal range of motion. He exhibits no edema or tenderness.  Neurological: He is alert and oriented to person, place, and time.  Skin: Skin is warm and dry. No rash noted. No erythema.  Psychiatric: He has a normal mood and affect. His behavior is normal.  Nursing note and vitals reviewed.    ED Treatments / Results  Labs (all labs ordered are listed, but only abnormal results are displayed) Labs Reviewed  BASIC METABOLIC PANEL - Abnormal; Notable for the following:       Result Value   Potassium 3.2 (*)    Glucose, Bld 102 (*)    All other components within normal limits  CBC  TROPONIN I  MAGNESIUM    EKG  EKG Interpretation  Date/Time:  Wednesday April 30 2017 12:33:18 EDT Ventricular Rate:  172 PR Interval:    QRS Duration: 88 QT Interval:  292 QTC Calculation: 493 R Axis:   93 Text Interpretation:  new  Atrial fibrillation with rapid ventricular response Rightward axis ST & T wave abnormality, consider inferolateral ischemia or digitalis effect Abnormal ECG Confirmed by Gwyneth SproutPlunkett, Kamal Jurgens ([id number]) on 04/30/2017 1:07:25 PM       Radiology Dg Chest 2 View  Result Date: 04/29/2017 CLINICAL DATA:  Pt. Stated, I've had some acid reflux, with some lightheadedness. And today I've had chest pain. EXAM: CHEST  2 VIEW COMPARISON:  07/07/2014 FINDINGS: Heart size is normal. The lungs are clear. No pulmonary edema. Convex right scoliosis appear stable. IMPRESSION: No evidence for acute cardiopulmonary abnormality. Electronically Signed   By: Norva PavlovElizabeth  Brown M.D.   On: 04/29/2017 19:27    Procedures .Sedation Date/Time: 04/30/2017 2:03 PM Performed by: Gwyneth SproutPLUNKETT, Tedford Berg Authorized by: Gwyneth SproutPLUNKETT, Elford Evilsizer   Consent:    Consent obtained:  Verbal and written   Consent given by:  Patient   Risks  discussed:  Inadequate sedation, prolonged sedation necessitating reversal, respiratory compromise necessitating ventilatory assistance and intubation, nausea and vomiting   Alternatives discussed:  Anxiolysis Indications:    Procedure performed:  Cardioversion   Procedure necessitating sedation performed by:  Physician performing sedation   Intended level of sedation:  Moderate (conscious sedation) Pre-sedation assessment:    Time since last food or drink:  2 hours   ASA classification: class 1 - normal, healthy patient     Neck mobility: normal     Mouth opening:  3 or more finger widths   Thyromental distance:  3 finger widths   Mallampati score:  II - soft palate, uvula, fauces visible   Pre-sedation assessments completed and reviewed: airway patency, cardiovascular function, mental status and respiratory function     Pre-sedation assessment completed:  04/30/2017 2:30 PM Immediate pre-procedure details:  Reassessment: Patient reassessed immediately prior to procedure     Reviewed: vital signs and relevant labs/tests     Verified: bag valve mask available, emergency equipment available, intubation equipment available, IV patency confirmed, oxygen available and suction available   Procedure details (see MAR for exact dosages):    Preoxygenation:  Nasal cannula   Sedation:  Propofol   Analgesia:  None   Intra-procedure monitoring:  Blood pressure monitoring, cardiac monitor, continuous capnometry, continuous pulse oximetry, frequent vital sign checks and frequent LOC assessments   Intra-procedure events: none     Total Provider sedation time (minutes):  10 Post-procedure details:    Post-sedation assessment completed:  04/30/2017 3:00 PM   Attendance: Constant attendance by certified staff until patient recovered     Recovery: Patient returned to pre-procedure baseline     Post-sedation assessments completed and reviewed: airway patency, cardiovascular function, mental status and  respiratory function     Patient is stable for discharge or admission: yes     Patient tolerance:  Tolerated well, no immediate complications .Cardioversion Date/Time: 04/30/2017 3:33 PM Performed by: Gwyneth Sprout Authorized by: Gwyneth Sprout   Consent:    Consent obtained:  Verbal and written   Consent given by:  Patient   Risks discussed:  Induced arrhythmia and pain   Alternatives discussed:  Rate-control medication, observation and delayed treatment Pre-procedure details:    Cardioversion basis:  Elective   Rhythm:  Atrial fibrillation   Electrode placement:  Anterior-posterior Attempt one:    Cardioversion mode:  Synchronous   Waveform:  Monophasic   Shock (Joules):  120   Shock outcome:  Conversion to normal sinus rhythm Post-procedure details:    Patient status:  Awake   Patient tolerance of procedure:  Tolerated well, no immediate complications   (including critical care time)  Medications Ordered in ED Medications  diltiazem (CARDIZEM) 100 mg in dextrose 5 % 100 mL (1 mg/mL) infusion (not administered)  propofol (DIPRIVAN) 10 mg/mL bolus/IV push 60.1 mg (not administered)  diltiazem (CARDIZEM) injection 20 mg (20 mg Intravenous Given 04/30/17 1312)     Initial Impression / Assessment and Plan / ED Course  I have reviewed the triage vital signs and the nursing notes.  Pertinent labs & imaging results that were available during my care of the patient were reviewed by me and considered in my medical decision making (see chart for details).   patient returning today because he is not feeling any better after being seen yesterday and having palpitations. Patient found to be in atrial fibrillation with RVR with a prior history of similar status post ablation. The patient has been having URI symptoms but does not seem to be taking any type of Sudafed products or using albuterol at this time. Patient was also found to be hypertensive. Chest x-ray without acute  findings. BMP with a potassium of 3.2 and CBC within normal limits. After Cardizem bolus improvement of heart rate but remains in atrial fibrillation. Patient does have an EKG from yesterday which showed sinus rhythm.    3:45 PM Cardioversion was a success with heart rate in the 60s in sinus rhythm. Patient was started on Eliquis and Cardizem 120 per cardiology.  Patient will follow-up in A. fib clinic. Social work spoke with the patient and is going to help him get a regular doctor. He will most likely need testing for his asthma which may be causing this ongoing mucus and cough that he can't seem to get rid of.  This  patients CHA2DS2-VASc Score and unadjusted Ischemic Stroke Rate (% per year) is equal to 0.6 % stroke rate/year from a score of 1  Above score calculated as 1 point each if present [CHF, HTN, DM, Vascular=MI/PAD/Aortic Plaque, Age if 65-74, or Male] Above score calculated as 2 points each if present [Age > 75, or Stroke/TIA/TE]     Final Clinical Impressions(s) / ED Diagnoses   Final diagnoses:  Atrial fibrillation with RVR (HCC)    New Prescriptions New Prescriptions   DILTIAZEM (CARDIZEM CD) 120 MG 24 HR CAPSULE    Take 1 capsule (120 mg total) by mouth daily.     Gwyneth Sprout, MD 04/30/17 1550

## 2017-04-30 NOTE — ED Provider Notes (Signed)
[facility] Provider Note   CSN: [id number] Arrival date & time: 04/29/17  1839     History   Chief Complaint Chief Complaint  Patient presents with  . Chest Pain  . Heartburn  . Dizziness    HPI Christian Schroeder is a 36 y.o. male.  Patient is a 36 year old male with history of atrial fibrillation and hypertension. He presents today with multiple complaints including tightness in his throat, sharp pain in his chest and upper abdomen, nausea, decreased appetite. The symptoms have been ongoing for the past week. He states that when he eats he fills up quickly and feels very nauseated. He denies any fevers or chills. He denies any diarrhea or constipation.   The history is provided by the patient.  Chest Pain   This is a new problem. Episode onset: One week ago. The problem occurs constantly. The problem has been gradually worsening. The pain is associated with eating. The pain is present in the substernal region. The pain is moderate. The pain does not radiate. Associated symptoms include dizziness.  Heartburn  Associated symptoms include chest pain.  Dizziness  Associated symptoms: chest pain     Past Medical History:  Diagnosis Date  . Asthma   . Atrial fibrillation (HCC)   . Hypertension     There are no active problems to display for this patient.   Past Surgical History:  Procedure Laterality Date  . CARDIAC SURGERY         Home Medications    Prior to Admission medications   Medication Sig Start Date End Date Taking? Authorizing Provider  amoxicillin-clavulanate (AUGMENTIN) 875-125 MG per tablet Take 1 tablet by mouth once. 07/07/14   Marisa Severin, MD  azithromycin (ZITHROMAX) 250 MG tablet Take 1 tablet (250 mg total) by mouth daily. 07/07/14   Marisa Severin, MD  diazepam (VALIUM) 5 MG tablet Take 1 tablet (5 mg total) by mouth every 6 (six) hours as needed for anxiety. 02/23/13   Benjiman Core, MD  ibuprofen (ADVIL,MOTRIN) 600 MG tablet Take 1 tablet  (600 mg total) by mouth every 6 (six) hours as needed for pain. 02/23/13   Benjiman Core, MD  ipratropium (ATROVENT) 0.06 % nasal spray Place 2 sprays into both nostrils 4 (four) times daily. 03/23/17   Dorena Bodo, NP  lisinopril (PRINIVIL,ZESTRIL) 5 MG tablet Take 5 mg by mouth daily.    [provider]  oxyCODONE-acetaminophen (PERCOCET/ROXICET) 5-325 MG per tablet Take 1-2 tablets by mouth every 6 (six) hours as needed for pain. 02/23/13   Benjiman Core, MD    Family History No family history on file.  Social History Social History  Substance Use Topics  . Smoking status: Current Every Day Smoker    Packs/day: 1.00    Types: Cigarettes  . Smokeless tobacco: Current User  . Alcohol use No     Allergies   Patient has no known allergies.   Review of Systems Review of Systems  Cardiovascular: Positive for chest pain.  Gastrointestinal: Positive for heartburn.  Neurological: Positive for dizziness.  All other systems reviewed and are negative.    Physical Exam Updated Vital Signs BP (!) 156/86   Pulse 61   Temp 97.8 F (36.6 C) (Oral)   Resp 16   Ht  (1.93 m)   Wt 120.2 kg (265 lb)   SpO2 99%   BMI 32.26 kg/m   Physical Exam  Constitutional: He is oriented to person, place, and time. He appears well-developed and well-nourished.  No distress.  HENT:  Head: Normocephalic and atraumatic.  Mouth/Throat: Oropharynx is clear and moist.  Neck: Normal range of motion. Neck supple.  Cardiovascular: Normal rate and regular rhythm.  Exam reveals no friction rub.   No murmur heard. Pulmonary/Chest: Effort normal and breath sounds normal. No respiratory distress. He has no wheezes. He has no rales.  Abdominal: Soft. Bowel sounds are normal. He exhibits no distension. There is no tenderness.  Musculoskeletal: Normal range of motion. He exhibits no edema.  Neurological: He is alert and oriented to person, place, and time. Coordination normal.  Skin:  Skin is warm and dry. He is not diaphoretic.  Nursing note and vitals reviewed.    ED Treatments / Results  Labs (all labs ordered are listed, but only abnormal results are displayed) Labs Reviewed  BASIC METABOLIC PANEL - Abnormal; Notable for the following:       Result Value   Potassium 3.2 (*)    Glucose, Bld 123 (*)    All other components within normal limits  CBC  I-STAT TROPONIN, ED    EKG  EKG Interpretation  Date/Time:  Tuesday April 29 2017 18:37:54 EDT Ventricular Rate:  75 PR Interval:  160 QRS Duration: 116 QT Interval:  366 QTC Calculation: 408 R Axis:   100 Text Interpretation:   Poor data quality, interpretation may be adversely affected Normal sinus rhythm with sinus arrhythmia Rightward axis ST & Marked T-wave abnormality, consider inferolateral ischemia Abnormal ECG No significant change since last tracing Confirmed by Raeford RazorKohut, Stephen [phone]([id number]) on 04/29/2017 6:49:36 PM       Radiology Dg Chest 2 View  Result Date: 04/29/2017 CLINICAL DATA:  Pt. Stated, I've had some acid reflux, with some lightheadedness. And today I've had chest pain. EXAM: CHEST  2 VIEW COMPARISON:  07/07/2014 FINDINGS: Heart size is normal. The lungs are clear. No pulmonary edema. Convex right scoliosis appear stable. IMPRESSION: No evidence for acute cardiopulmonary abnormality. Electronically Signed   By: Norva PavlovElizabeth  Brown M.D.   On: 04/29/2017 19:27    Procedures Procedures (including critical care time)  Medications Ordered in ED Medications - No data to display   Initial Impression / Assessment and Plan / ED Course  I have reviewed the triage vital signs and the nursing notes.  Pertinent labs & imaging results that were available during my care of the patient were reviewed by me and considered in my medical decision making (see chart for details).  Patient presents here with multiple complaints as described in the history of present illness. I see nothing that indicates a  cardiac etiology. His EKG is unchanged and troponin is negative. Remainder of his laboratory studies are all unremarkable and chest x-ray is clear.  I am uncertain as to the exact cause of all of his symptoms, however nothing today appears emergent. He will be discharged with Prilosec for presumed gastritis/reflux and is to follow-up with his primary Dr. if not improving.  Final Clinical Impressions(s) / ED Diagnoses   Final diagnoses:  None    New Prescriptions New Prescriptions   No medications on file     Geoffery Lyonselo, Scherrie Seneca, MD 04/30/17 [phone]

## 2017-04-30 NOTE — ED Triage Notes (Signed)
  Pt's HR 170

## 2017-05-02 ENCOUNTER — Encounter (HOSPITAL_COMMUNITY): Payer: Self-pay | Admitting: Nurse Practitioner

## 2017-05-02 ENCOUNTER — Ambulatory Visit (HOSPITAL_COMMUNITY)
Admission: RE | Admit: 2017-05-02 | Discharge: 2017-05-02 | Disposition: A | Discharge status: Home / Self Care | Payer: Medicaid Other | Source: Ambulatory Visit | Attending: Nurse Practitioner | Admitting: Nurse Practitioner

## 2017-05-02 VITALS — BP 140/88 | HR 73 | Ht 76.0 in | Wt 280.0 lb

## 2017-05-02 DIAGNOSIS — J45909 Unspecified asthma, uncomplicated: Secondary | ICD-10-CM | POA: Insufficient documentation

## 2017-05-02 DIAGNOSIS — I480 Paroxysmal atrial fibrillation: Secondary | ICD-10-CM | POA: Diagnosis present

## 2017-05-02 DIAGNOSIS — Z79899 Other long term (current) drug therapy: Secondary | ICD-10-CM | POA: Insufficient documentation

## 2017-05-02 DIAGNOSIS — I10 Essential (primary) hypertension: Secondary | ICD-10-CM | POA: Diagnosis not present

## 2017-05-02 DIAGNOSIS — F17210 Nicotine dependence, cigarettes, uncomplicated: Secondary | ICD-10-CM | POA: Insufficient documentation

## 2017-05-02 MED ORDER — APIXABAN 5 MG PO TABS: 5.0000 mg | ORAL_TABLET | Freq: Two times a day (BID) | ORAL | 0 refills | Status: DC

## 2017-05-02 NOTE — Progress Notes (Signed)
Primary Care Physician: Patient, No Pcp Per Referring Physician: [facility] ER   Christian Schroeder is a 36 y.o. male that presented to [facility] ER with Afib with RVR at 193 bpm 9/11, he woke from sleep with the sensation that he could not breath. He was cardioverted in the ER. He was given one dose of Eliquis but not an prescription. ER note states that he had an ablation at age 34 in the [place], but carefully questioning pt, he stated that a tube was put down his throat, he went to sleep and woke up in SR so I think he had a TEE guided cardioversion. He does not know a trigger, other than he woke up with afib and has snored and had apnea episodes for years. No previus sleep study. He does drink a lot of [facility], but no other caffeine, minimal alcohol, no tobacco, he is overweight.  Today, he denies symptoms of palpitations, chest pain, shortness of breath, orthopnea, PND, lower extremity edema, dizziness, presyncope, syncope, or neurologic sequela. The patient is tolerating medications without difficulties and is otherwise without complaint today.   Past Medical History:  Diagnosis Date  . Asthma   . Atrial fibrillation (HCC)   . Hypertension    Past Surgical History:  Procedure Laterality Date  . CARDIAC SURGERY      Current Outpatient Prescriptions  Medication Sig Dispense Refill  . diltiazem (CARDIZEM CD) 120 MG 24 hr capsule Take 1 capsule (120 mg total) by mouth daily. 20 capsule 0  . omeprazole (PRILOSEC) 20 MG capsule Take 1 capsule (20 mg total) by mouth 2 (two) times daily before a meal. 40 capsule 0  . apixaban (ELIQUIS) 5 MG TABS tablet Take 1 tablet (5 mg total) by mouth 2 (two) times daily. 60 tablet 0   No current facility-administered medications for this encounter.     Allergies  Allergen Reactions  . Latex Itching and Rash    Social History   Social History  . Marital status: Single    Spouse name: N/A  . Number of children: N/A  . Years of education: N/A    Occupational History  . Not on file.   Social History Main Topics  . Smoking status: Current Every Day Smoker    Packs/day: 1.00    Types: Cigarettes  . Smokeless tobacco: Current User  . Alcohol use No  . Drug use: No  . Sexual activity: Not on file   Other Topics Concern  . Not on file   Social History Narrative  . No narrative on file    No family history on file.  ROS- All systems are reviewed and negative except as per the HPI above  Physical Exam: Vitals:   05/02/17 1032  BP: 140/88  Pulse: 73  Weight: 280 lb (127 kg)  Height:  (1.93 m)   Wt Readings from Last 3 Encounters:  05/02/17 280 lb (127 kg)  04/29/17 265 lb (120.2 kg)  02/23/13 260 lb (117.9 kg)    Labs: Lab Results  Component Value Date   NA 138 04/30/2017   K 3.2 (L) 04/30/2017   CL 101 04/30/2017   CO2 28 04/30/2017   GLUCOSE 102 (H) 04/30/2017   BUN 9 04/30/2017   CREATININE 1.10 04/30/2017   CALCIUM 9.9 04/30/2017   MG 2.0 04/30/2017   No results found for: INR No results found for: CHOL, HDL, LDLCALC, TRIG   GEN- The patient is well appearing, alert and oriented x 3  today.   Head- normocephalic, atraumatic Eyes-  Sclera clear, conjunctiva pink Ears- hearing intact Oropharynx- clear Neck- supple, no JVP Lymph- no cervical lymphadenopathy Lungs- Clear to ausculation bilaterally, normal work of breathing Heart- Regular rate and rhythm, no murmurs, rubs or gallops, PMI not laterally displaced GI- soft, NT, ND, + BS Extremities- no clubbing, cyanosis, or edema MS- no significant deformity or atrophy Skin- no rash or lesion Psych- euthymic mood, full affect Neuro- strength and sensation are intact  EKG-NSR at 73 bpm, pr int 174 ms, qrs int 124 ms, qtc 416 ms Ekg's from ER reviewed Epic records reviewed    Assessment and Plan: 1. Paroxysmal symptomatic afib Successfully cardioverted  Started on Cardizem 120 mg qd Rx given for 30 days of Eliquis 5 mg bid Chadsvasc  score is 0, possible 1 for undiagnosed HTN, will take for 30 days for cardioversion protocol and then stop Echo Referred for sleep study for years of snoring/witnessed apnea episodes  Encouraged  to cut back on caffeine Encouraged to be more active and lose weight If possible, he was asked to get medial records from [place] re prior afib  F/u in one month  Lupita Leash [facility] [address] [place], [place] [address] [phone]

## 2017-05-02 NOTE — Patient Instructions (Addendum)
Start Eliquis 5 mg, take one tablet twice a day.     Your echo has been scheduled for 10/21 at 2:00

## 2017-05-05 NOTE — Discharge Instructions (Signed)
Activity Instructions   You must avoid lifting more than *** pounds until your physician instructs you differently. You should avoid {d/c avoid/resume:120111}. You may resume {d/c avoid/resume:120111}.   Personal Items   Please collect all clothing which belongs to you from your nurse. Please collect any valuables you stored during your stay from the front desk, and please remember all of your personal items, such as dentures, canes, and eyeglasses.   Scheduled Meds: Continuous Infusions: PRN Meds:

## 2017-05-09 ENCOUNTER — Ambulatory Visit (HOSPITAL_COMMUNITY)
Admission: RE | Admit: 2017-05-09 | Discharge: 2017-05-09 | Disposition: A | Discharge status: Home / Self Care | Payer: Medicaid Other | Source: Ambulatory Visit | Attending: Nurse Practitioner | Admitting: Nurse Practitioner

## 2017-05-09 DIAGNOSIS — I517 Cardiomegaly: Secondary | ICD-10-CM | POA: Insufficient documentation

## 2017-05-09 DIAGNOSIS — I420 Dilated cardiomyopathy: Secondary | ICD-10-CM | POA: Diagnosis not present

## 2017-05-09 DIAGNOSIS — I480 Paroxysmal atrial fibrillation: Secondary | ICD-10-CM | POA: Diagnosis not present

## 2017-05-09 NOTE — Progress Notes (Signed)
  Echocardiogram 2D Echocardiogram has been performed.  Christian Schroeder 05/09/2017, 3:03 PM

## 2017-05-14 ENCOUNTER — Telehealth: Payer: Self-pay | Admitting: *Deleted

## 2017-05-14 DIAGNOSIS — I4891 Unspecified atrial fibrillation: Secondary | ICD-10-CM

## 2017-05-14 NOTE — Telephone Encounter (Signed)
Per Rudi Coco split night sleep study ordered.

## 2017-05-14 NOTE — Telephone Encounter (Signed)
-----   Message from Shona Simpson, RN sent at 05/14/2017 10:13 AM EDT ----- Regarding: sleep study Pt needs sleep study for afib. Pt will be expecting call. Thanks stacy

## 2017-05-19 ENCOUNTER — Other Ambulatory Visit (HOSPITAL_COMMUNITY): Payer: Self-pay | Admitting: *Deleted

## 2017-05-19 MED ORDER — DILTIAZEM HCL ER COATED BEADS 120 MG PO CP24: 120.0000 mg | ORAL_CAPSULE | Freq: Every day | ORAL | 3 refills | Status: DC

## 2017-05-21 ENCOUNTER — Other Ambulatory Visit (HOSPITAL_COMMUNITY): Payer: Self-pay | Admitting: *Deleted

## 2017-05-21 MED ORDER — DILTIAZEM HCL ER COATED BEADS 120 MG PO CP24: 120.0000 mg | ORAL_CAPSULE | Freq: Every day | ORAL | 3 refills | Status: DC

## 2017-05-22 NOTE — Telephone Encounter (Signed)
Informed patient of upcoming sleep study and patient understanding was verbalized. Patient understands his sleep study is scheduled for Wednesday July 02 2017. Patient understands his sleep study will be done at [facility] sleep lab. Patient understands he will receive a sleep packet in a week or so. Patient understands to call if he does not receive the sleep packet in a timely manner. Patient agrees with treatment and thanked me for call.

## 2017-05-23 ENCOUNTER — Other Ambulatory Visit (HOSPITAL_COMMUNITY): Payer: Self-pay | Admitting: *Deleted

## 2017-05-23 MED ORDER — OMEPRAZOLE 20 MG PO CPDR: 20.0000 mg | DELAYED_RELEASE_CAPSULE | Freq: Two times a day (BID) | ORAL | 1 refills | Status: DC

## 2017-06-03 ENCOUNTER — Inpatient Hospital Stay (HOSPITAL_COMMUNITY)
Admission: RE | Admit: 2017-06-03 | Discharge: 2017-06-03 | Disposition: A | Discharge status: Home / Self Care | Payer: Medicaid Other | Source: Ambulatory Visit | Attending: Nurse Practitioner | Admitting: Nurse Practitioner

## 2017-06-03 ENCOUNTER — Other Ambulatory Visit (HOSPITAL_COMMUNITY): Payer: Self-pay | Admitting: Cardiology

## 2017-06-03 MED ORDER — APIXABAN 5 MG PO TABS: 5.0000 mg | ORAL_TABLET | Freq: Two times a day (BID) | ORAL | 2 refills | Status: DC

## 2017-06-03 NOTE — Progress Notes (Signed)
Refill Eliquis per patient request

## 2017-06-17 ENCOUNTER — Ambulatory Visit (HOSPITAL_COMMUNITY): Payer: Medicaid Other | Admitting: Nurse Practitioner

## 2017-06-24 ENCOUNTER — Encounter (HOSPITAL_COMMUNITY): Payer: Self-pay | Admitting: Nurse Practitioner

## 2017-06-24 ENCOUNTER — Ambulatory Visit (HOSPITAL_COMMUNITY)
Admission: RE | Admit: 2017-06-24 | Discharge: 2017-06-24 | Disposition: A | Discharge status: Home / Self Care | Payer: Medicaid Other | Source: Ambulatory Visit | Attending: Nurse Practitioner | Admitting: Nurse Practitioner

## 2017-06-24 VITALS — BP 164/96 | HR 71 | Ht 76.0 in | Wt 292.0 lb

## 2017-06-24 DIAGNOSIS — Z91040 Latex allergy status: Secondary | ICD-10-CM | POA: Insufficient documentation

## 2017-06-24 DIAGNOSIS — Z79899 Other long term (current) drug therapy: Secondary | ICD-10-CM | POA: Diagnosis not present

## 2017-06-24 DIAGNOSIS — I10 Essential (primary) hypertension: Secondary | ICD-10-CM | POA: Insufficient documentation

## 2017-06-24 DIAGNOSIS — I480 Paroxysmal atrial fibrillation: Secondary | ICD-10-CM | POA: Insufficient documentation

## 2017-06-24 DIAGNOSIS — R0681 Apnea, not elsewhere classified: Secondary | ICD-10-CM | POA: Diagnosis not present

## 2017-06-24 DIAGNOSIS — R0683 Snoring: Secondary | ICD-10-CM | POA: Diagnosis not present

## 2017-06-24 DIAGNOSIS — F17210 Nicotine dependence, cigarettes, uncomplicated: Secondary | ICD-10-CM | POA: Diagnosis not present

## 2017-06-24 DIAGNOSIS — I4891 Unspecified atrial fibrillation: Secondary | ICD-10-CM | POA: Diagnosis present

## 2017-06-24 NOTE — Patient Instructions (Signed)
Stop Christian Schroeder   Scheduler will be in touch for follow up with general cardiology.

## 2017-06-24 NOTE — Progress Notes (Signed)
Primary Care Physician: Patient, No Pcp Per Referring Physician: [facility] ER   Christian Schroeder is a 36 y.o. male that presented to [facility] ER with Afib with RVR at 193 bpm 9/11, he woke from sleep with the sensation that he could not breath. He was cardioverted in the ER. He was given one dose of Eliquis but not an prescription. ER note states that he had an ablation at age 70 in the [place], but carefully questioning pt, he stated that a tube was put down his throat, he went to sleep and woke up in SR so I think he had a TEE guided cardioversion. He does not know a trigger, other than he woke up with afib and has snored and had apnea episodes for years. No previous sleep study. He does drink a lot of [facility], but no other caffeine, minimal alcohol, no tobacco, he is overweight.  F/u in afib clinic after missing initial f/u visit. I told him on last visit, he could stop DOAC after one month s/p cardioversion but he has continued. He states that his memory is poor thinking possible new drugs?  No return of afib. Sleep study is pending. Echo showed LVH.  Today, he denies symptoms of palpitations, chest pain, shortness of breath, orthopnea, PND, lower extremity edema, dizziness, presyncope, syncope, or neurologic sequela. The patient is tolerating medications without difficulties and is otherwise without complaint today.   Past Medical History:  Diagnosis Date  . Asthma   . Atrial fibrillation ([place])   . Hypertension    Past Surgical History:  Procedure Laterality Date  . CARDIAC SURGERY      Current Outpatient Medications  Medication Sig Dispense Refill  . diltiazem (CARDIZEM CD) 120 MG 24 hr capsule Take 1 capsule (120 mg total) by mouth daily. 30 capsule 3  . omeprazole (PRILOSEC) 20 MG capsule Take 1 capsule (20 mg total) by mouth 2 (two) times daily before a meal. 60 capsule 1   No current facility-administered medications for this encounter.     Allergies  Allergen Reactions  . Latex  Itching and Rash    Social History   Socioeconomic History  . Marital status: Single    Spouse name: Not on file  . Number of children: Not on file  . Years of education: Not on file  . Highest education level: Not on file  Social Needs  . Financial resource strain: Not on file  . Food insecurity - worry: Not on file  . Food insecurity - inability: Not on file  . Transportation needs - medical: Not on file  . Transportation needs - non-medical: Not on file  Occupational History  . Not on file  Tobacco Use  . Smoking status: Current Every Day Smoker    Packs/day: 1.00    Types: Cigarettes  . Smokeless tobacco: Current User  Substance and Sexual Activity  . Alcohol use: No  . Drug use: No  . Sexual activity: Not on file  Other Topics Concern  . Not on file  Social History Narrative  . Not on file    No family history on file.  ROS- All systems are reviewed and negative except as per the HPI above  Physical Exam: Vitals:   06/24/17 1101  BP: (!) 164/96  Pulse: 71  Weight: 292 lb (132.5 kg)  Height: '6\' 4"'$  (1.93 m)   Wt Readings from Last 3 Encounters:  06/24/17 292 lb (132.5 kg)  05/02/17 280 lb (127 kg)  04/29/17  265 lb (120.2 kg)    Labs: Lab Results  Component Value Date   NA 138 04/30/2017   K 3.2 (L) 04/30/2017   CL 101 04/30/2017   CO2 28 04/30/2017   GLUCOSE 102 (H) 04/30/2017   BUN 9 04/30/2017   CREATININE 1.10 04/30/2017   CALCIUM 9.9 04/30/2017   MG 2.0 04/30/2017   No results found for: INR No results found for: CHOL, HDL, LDLCALC, TRIG   GEN- The patient is well appearing, alert and oriented x 3 today.   Head- normocephalic, atraumatic Eyes-  Sclera clear, conjunctiva pink Ears- hearing intact Oropharynx- clear Neck- supple, no JVP Lymph- no cervical lymphadenopathy Lungs- Clear to ausculation bilaterally, normal work of breathing Heart- Regular rate and rhythm, no murmurs, rubs or gallops, PMI not laterally displaced GI- soft,  NT, ND, + BS Extremities- no clubbing, cyanosis, or edema MS- no significant deformity or atrophy Skin- no rash or lesion Psych- euthymic mood, full affect Neuro- strength and sensation are intact  EKG-NSR at 73 bpm, pr int 174 ms, qrs int 124 ms, qtc 416 ms Ekg's from ER reviewed Epic records reviewed Echo-- Left ventricle: GLSS is normal at -20% The cavity size was   normal. There was moderate concentric hypertrophy. Systolic   function was normal. The estimated ejection fraction was in the   range of 55% to 60%. Wall motion was normal; there were no   regional wall motion abnormalities. Left ventricular diastolic   function parameters were normal. - Left atrium: The atrium was mildly dilated.   Assessment and Plan: 1. Paroxysmal symptomatic afib Successfully cardioverted 9/12 Continue on  Cardizem 120 mg qd Can stop  Eliquis 5 mg bid as he has met the 30 day post protocol requirement Chadsvasc score is 0, possible 1 for undiagnosed HTN  2. Lifestyle issues  Referred for sleep study for years of snoring/witnessed apnea episodes, pending Encouraged  to cut back on caffeine Encouraged to be more active and lose weight Avoid salt If possible, he was asked to get medial records from [place] re prior  Tachy episode  F/u with general cardiology in 3 months He was asked to establish with a PCP, can discuss concerns with memory  If not better off eliquis, which I doubt is contributing nor cardizem  Butch Penny C. Tasheba Henson, [facility] [address] [place], [place] [address] [phone]

## 2017-07-02 ENCOUNTER — Ambulatory Visit (HOSPITAL_BASED_OUTPATIENT_CLINIC_OR_DEPARTMENT_OTHER): Payer: No Typology Code available for payment source | Attending: Nurse Practitioner

## 2017-09-23 NOTE — Progress Notes (Deleted)
Cardiology Office Note   Date:  09/23/2017   ID:  Christian Schroeder, DOB 1981/03/24, MRN [id number]  PCP:  Patient, No Pcp Per  Cardiologist:   Chilton Siiffany Phelan, MD   No chief complaint on file.     History of Present Illness: Christian Schroeder is a 37 y.o. male with hypertension, paroxysmal atrial fibrillation, likely OSA, and asthma who presents for follow-up.  He was initially seen in the emergency department 04/2017 with new onset atrial fibrillation.  His ventricular rate was 193 bpm.  He had an echo 05/09/17 that revealed LVEF 55-60% with moderate LVH.  His left atrium was mildly dilated.  Christian Schroeder reportedly had an episode of atrial fibrillation at age 37.  He underwent TEE/DC cardioversion and ablation.  thyroid  Past Medical History:  Diagnosis Date  . Asthma   . Atrial fibrillation (HCC)   . Hypertension     Past Surgical History:  Procedure Laterality Date  . CARDIAC SURGERY       Current Outpatient Medications  Medication Sig Dispense Refill  . diltiazem (CARDIZEM CD) 120 MG 24 hr capsule Take 1 capsule (120 mg total) by mouth daily. 30 capsule 3  . omeprazole (PRILOSEC) 20 MG capsule Take 1 capsule (20 mg total) by mouth 2 (two) times daily before a meal. 60 capsule 1   No current facility-administered medications for this visit.     Allergies:   Latex    Social History:  The patient  reports that he has been smoking cigarettes.  He has been smoking about 1.00 pack per day. He uses smokeless tobacco. He reports that he does not drink alcohol or use drugs.   Family History:  The patient's ***family history is not on file.    ROS:  Please see the history of present illness.   Otherwise, review of systems are positive for {NONE DEFAULTED:18576::"none"}.   All other systems are reviewed and negative.    PHYSICAL EXAM: VS:  There were no vitals taken for this visit. , BMI There is no height or weight on file to calculate BMI. GENERAL:  Well appearing HEENT:   Pupils equal round and reactive, fundi not visualized, oral mucosa unremarkable NECK:  No jugular venous distention, waveform within normal limits, carotid upstroke brisk and symmetric, no bruits, no thyromegaly LYMPHATICS:  No cervical adenopathy LUNGS:  Clear to auscultation bilaterally HEART:  RRR.  PMI not displaced or sustained,S1 and S2 within normal limits, no S3, no S4, no clicks, no rubs, *** murmurs ABD:  Flat, positive bowel sounds normal in frequency in pitch, no bruits, no rebound, no guarding, no midline pulsatile mass, no hepatomegaly, no splenomegaly EXT:  2 plus pulses throughout, no edema, no cyanosis no clubbing SKIN:  No rashes no nodules NEURO:  Cranial nerves II through XII grossly intact, motor grossly intact throughout PSYCH:  Cognitively intact, oriented to person place and time    EKG:  EKG {ACTION; IS/IS WUJ:[id number]}OT:[id number]} ordered today. The ekg ordered today demonstrates ***  Echo 05/06/17: Study Conclusions  - Left ventricle: GLSS is normal at -20% The cavity size was   normal. There was moderate concentric hypertrophy. Systolic   function was normal. The estimated ejection fraction was in the   range of 55% to 60%. Wall motion was normal; there were no   regional wall motion abnormalities. Left ventricular diastolic   function parameters were normal. - Left atrium: The atrium was mildly dilated.   Recent Labs: 04/30/2017: BUN 9; Creatinine, Ser  1.10; Hemoglobin 15.1; Magnesium 2.0; Platelets 312; Potassium 3.2; Sodium 138    Lipid Panel No results found for: CHOL, TRIG, HDL, CHOLHDL, VLDL, LDLCALC, LDLDIRECT    Wt Readings from Last 3 Encounters:  06/24/17 292 lb (132.5 kg)  05/02/17 280 lb (127 kg)  04/29/17 265 lb (120.2 kg)      ASSESSMENT AND PLAN:  # Paroxysmal atrial fibrillation:    Current medicines are reviewed at length with the patient today.  The patient {ACTIONS; HAS/DOES NOT HAVE:19233} concerns regarding medicines.  The  following changes have been made:  {PLAN; NO CHANGE:13088:s}  Labs/ tests ordered today include: *** No orders of the defined types were placed in this encounter.    Disposition:   FU with ***    This note was written with the assistance of speech recognition software.  Please excuse any transcriptional errors.  Signed, [place] C. Duke Salvia, MD, [facility]  09/23/2017 9:03 AM    [facility]

## 2017-09-24 ENCOUNTER — Ambulatory Visit: Payer: Medicaid Other | Admitting: Cardiovascular Disease

## 2017-09-24 ENCOUNTER — Encounter (INDEPENDENT_AMBULATORY_CARE_PROVIDER_SITE_OTHER): Payer: Self-pay

## 2017-09-24 ENCOUNTER — Encounter: Payer: Self-pay | Admitting: Cardiovascular Disease

## 2017-09-24 DIAGNOSIS — I480 Paroxysmal atrial fibrillation: Secondary | ICD-10-CM | POA: Insufficient documentation

## 2017-09-24 DIAGNOSIS — I10 Essential (primary) hypertension: Secondary | ICD-10-CM

## 2017-09-24 NOTE — Patient Instructions (Addendum)
Medication Instructions:  Your physician recommends that you continue on your current medications as directed. Please refer to the Current Medication list given to you today.  Labwork: CBC/MAGNESIUM/TSH/FT4/CMET TODAY   Testing/Procedures: Your physician has recommended that you have a sleep study. This test records several body functions during sleep, including: brain activity, eye movement, oxygen and carbon dioxide blood levels, heart rate and rhythm, breathing rate and rhythm, the flow of air through your mouth and nose, snoring, body muscle movements, and chest and belly movement. THE OFFICE WILL CALL YOU WITH DATE AND TIME AFTER INSURANCE HAS APPROVED  Follow-Up: Your physician recommends that you schedule a follow-up appointment in: 3 MONTH OV   Any Other Special Instructions Will Be Listed Below (If Applicable). DECREASE YOUR SALT INTAKE  EXERCISE AT LEAST 150 MINUTES A WEEK   If you need a refill on your cardiac medications before your next appointment, please call your pharmacy.

## 2017-09-24 NOTE — Progress Notes (Signed)
Cardiology Office Note   Date:  09/26/2017   ID:  Kamareon Sciandra, DOB 1981-05-09, MRN [id number]  PCP:  Patient, No Pcp Per  Cardiologist:   Chilton Si, MD   No chief complaint on file.    History of Present Illness: Christian Schroeder is a 37 y.o. male with asthma and paroxysmal atrial fibrillation here for follow up.  He has a history of atrial fibrillation and underwent TEE/DCCV at age 64.  He subsequently underwent ablation while living in [place].  He was awakened from sleep 04/2017 with recurrent atrial fibrillation.  He was seen in the ED and his heart rate was 193 bpm.  When in atrial fibrillation he felt like his heart was racing and it was difficult to breathe.  He underwent DCCV and followed up in atrial fibrillation clinic 04/2017.  He was started on diltiazem and given a 30-day prescription of Eliquis.  He had an echo 04/2017 that revealed LVEF 55-60% and mildly dilated left atrium.  He was also referred for sleep study but has not yet had it.  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  He cancelled the sleep study 3 times due to work obligations.  Mr. Henken reports fatigue and stress.  He sometimes has chest pressure while at rest.  It occurs when he is tense and frustrated.  He also finds himself getting shaky.  He has social anxiety and gets nervous when he has to go into large group settings.  This has been new for him in the last couple years.  He has no exertional chest pain or shortness of breath.  He hasn't experienced any palpitations lately.  He also denies lower extremity edema, orthopnea or PND.  He continues to smoke 8 cigarettes daily since age 31.  He has tried using nicotine replacement gum, cold [place], and candy without success.  He notes that his diet has been poor.  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  He isn't getting any exercise outside of work.     Past Medical History:  Diagnosis Date  . Asthma   . Atrial  fibrillation (HCC)   . Hypertension     Past Surgical History:  Procedure Laterality Date  . CARDIAC SURGERY       Current Outpatient Medications  Medication Sig Dispense Refill  . diltiazem (CARDIZEM CD) 120 MG 24 hr capsule Take 1 capsule (120 mg total) by mouth daily. 30 capsule 3   No current facility-administered medications for this visit.     Allergies:   Latex    Social History:  The patient  reports that he has been smoking cigarettes.  He has been smoking about 1.00 pack per day. He uses smokeless tobacco. He reports that he does not drink alcohol or use drugs.   Family History:  The patient's family history includes Hypertension in his father and mother; Kidney disease in his brother and mother.    ROS:  Please see the history of present illness.   Otherwise, review of systems are positive for back pain, anxiety, weight gain.   All other systems are reviewed and negative.    PHYSICAL EXAM: VS:  BP (!) 159/92   Pulse 69   Ht 6\' 5"  (1.956 m)   Wt 294 lb (133.4 kg)   BMI 34.86 kg/m  , BMI Body mass index is 34.86 kg/m. GENERAL:  Well appearing.  No acute distress. HEENT:  Pupils  equal round and reactive, fundi not visualized, oral mucosa unremarkable NECK:  No jugular venous distention, waveform within normal limits, carotid upstroke brisk and symmetric, no bruits, no thyromegaly LUNGS:  Clear to auscultation bilaterally HEART:  RRR.  PMI not displaced or sustained,S1 and S2 within normal limits, no S3, no S4, no clicks, no rubs, no murmurs ABD:  Flat, positive bowel sounds normal in frequency in pitch, no bruits, no rebound, no guarding, no midline pulsatile mass, no hepatomegaly, no splenomegaly EXT:  2 plus pulses throughout, no edema, no cyanosis no clubbing SKIN:  No rashes no nodules NEURO:  Cranial nerves II through XII grossly intact, motor grossly intact throughout PSYCH:  Cognitively intact, oriented to person place and time   EKG:  EKG is ordered  today. The ekg ordered today demonstrates sinus rhythm.  Rate 69 bpm.  LVH with secondary repolarization abnormality  Echo 05/09/17: Study Conclusions  - Left ventricle: GLSS is normal at -20% The cavity size was   normal. There was moderate concentric hypertrophy. Systolic   function was normal. The estimated ejection fraction was in the   range of 55% to 60%. Wall motion was normal; there were no   regional wall motion abnormalities. Left ventricular diastolic   function parameters were normal. - Left atrium: The atrium was mildly dilated  Recent Labs: 09/24/2017: ALT 19; BUN 9; Creatinine, Ser 0.87; Hemoglobin 14.8; Magnesium 2.1; Platelets 301; Potassium 4.2; Sodium 140; TSH 0.570    Lipid Panel No results found for: CHOL, TRIG, HDL, CHOLHDL, VLDL, LDLCALC, LDLDIRECT    Wt Readings from Last 3 Encounters:  09/24/17 294 lb (133.4 kg)  06/24/17 292 lb (132.5 kg)  05/02/17 280 lb (127 kg)      ASSESSMENT AND PLAN:  # Paroxysmal atrial fibrillation:  Mr. Earlene PlaterDavis has remained in sinus rhythm since his cardioversion.  Check CBC, CMP, thyroid function and magnesium today.  Continue diltiazem.  He is not on anticoagulation due to CHA2DS2-Vasc score of 0.  Given that his BP is elevated, we would now consider him to have a score of 1.  He would like to try to get his BP down before starting any new medications.  He will schedule a sleep study to rule out OSA.   # Hypertension: Mr. Earlene PlaterDavis' blood pressure is elevated today and has been elevated multiple times in the past.  We discussed the importance of exercising 150 minutes per week and limiting salt and fast food.  He wants to work on this for 3 months before starting medication.    Current medicines are reviewed at length with the patient today.  The patient does not have concerns regarding medicines.  The following changes have been made:  no change  Labs/ tests ordered today include:   Orders Placed This Encounter  Procedures  .  CBC with Differential/Platelet  . TSH  . T4, free  . Comprehensive metabolic panel  . Magnesium  . EKG 12-Lead  . Split night study     Disposition:   FU with Latonia Conrow C. Duke Salviaandolph, MD, [facility] in 3 months.     This note was written with the assistance of speech recognition software.  Please excuse any transcriptional errors.  Signed, Liliah Dorian C. Duke Salviaandolph, MD, [facility]  09/26/2017 9:05 AM    [facility]

## 2017-09-25 LAB — COMPREHENSIVE METABOLIC PANEL
ALBUMIN: 4.7 g/dL (ref 3.5–5.5)
ALT: 19 IU/L (ref 0–44)
AST: 18 IU/L (ref 0–40)
Albumin/Globulin Ratio: 2 (ref 1.2–2.2)
Alkaline Phosphatase: 84 IU/L (ref 39–117)
BUN/Creatinine Ratio: 10 (ref 9–20)
BUN: 9 mg/dL (ref 6–20)
Bilirubin Total: 0.3 mg/dL (ref 0.0–1.2)
CO2: 26 mmol/L (ref 20–29)
CREATININE: 0.87 mg/dL (ref 0.76–1.27)
Calcium: 9.7 mg/dL (ref 8.7–10.2)
Chloride: 101 mmol/L (ref 96–106)
GFR calc Af Amer: 130 mL/min/{1.73_m2} (ref >59)
GFR calc non Af Amer: 113 mL/min/{1.73_m2} (ref >59)
GLOBULIN, TOTAL: 2.4 g/dL (ref 1.5–4.5)
Glucose: 84 mg/dL (ref 65–99)
Potassium: 4.2 mmol/L (ref 3.5–5.2)
SODIUM: 140 mmol/L (ref 134–144)
TOTAL PROTEIN: 7.1 g/dL (ref 6.0–8.5)

## 2017-09-25 LAB — CBC WITH DIFFERENTIAL/PLATELET
BASOS: 0 %
Basophils Absolute: 0 10*3/uL (ref 0.0–0.2)
EOS (ABSOLUTE): 0.2 10*3/uL (ref 0.0–0.4)
Eos: 3 %
HEMATOCRIT: 43.3 % (ref 37.5–51.0)
HEMOGLOBIN: 14.8 g/dL (ref 13.0–17.7)
IMMATURE GRANS (ABS): 0 10*3/uL (ref 0.0–0.1)
Immature Granulocytes: 0 %
LYMPHS: 42 %
Lymphocytes Absolute: 2.4 10*3/uL (ref 0.7–3.1)
MCH: 27.7 pg (ref 26.6–33.0)
MCHC: 34.2 g/dL (ref 31.5–35.7)
MCV: 81 fL (ref 79–97)
MONOCYTES: 8 %
Monocytes Absolute: 0.4 10*3/uL (ref 0.1–0.9)
NEUTROS ABS: 2.7 10*3/uL (ref 1.4–7.0)
Neutrophils: 47 %
Platelets: 301 10*3/uL (ref 150–379)
RBC: 5.34 x10E6/uL (ref 4.14–5.80)
RDW: 15 % (ref 12.3–15.4)
WBC: 5.8 10*3/uL (ref 3.4–10.8)

## 2017-09-25 LAB — MAGNESIUM: Magnesium: 2.1 mg/dL (ref 1.6–2.3)

## 2017-09-25 LAB — T4, FREE: Free T4: 1.41 ng/dL (ref 0.82–1.77)

## 2017-09-25 LAB — TSH: TSH: 0.57 u[IU]/mL (ref 0.450–4.500)

## 2017-09-26 ENCOUNTER — Encounter: Payer: Self-pay | Admitting: Cardiovascular Disease

## 2017-09-26 DIAGNOSIS — I10 Essential (primary) hypertension: Secondary | ICD-10-CM

## 2017-09-26 HISTORY — DX: Essential (primary) hypertension: I10

## 2017-10-06 ENCOUNTER — Ambulatory Visit (HOSPITAL_BASED_OUTPATIENT_CLINIC_OR_DEPARTMENT_OTHER): Payer: Medicaid Other | Attending: Cardiovascular Disease

## 2017-10-07 ENCOUNTER — Ambulatory Visit: Payer: Medicaid Other | Admitting: Cardiology

## 2017-10-07 ENCOUNTER — Other Ambulatory Visit (HOSPITAL_COMMUNITY): Payer: Self-pay | Admitting: Nurse Practitioner

## 2017-10-23 ENCOUNTER — Ambulatory Visit (HOSPITAL_BASED_OUTPATIENT_CLINIC_OR_DEPARTMENT_OTHER): Payer: Medicaid Other | Attending: Cardiovascular Disease

## 2017-11-06 ENCOUNTER — Telehealth: Payer: Self-pay | Admitting: *Deleted

## 2017-11-06 NOTE — Telephone Encounter (Signed)
Patient has been notified of sleep study scheduled for 11/20/17. [facility] given to [facility] in case he needs to contact them for any reason.

## 2017-11-20 ENCOUNTER — Ambulatory Visit (HOSPITAL_BASED_OUTPATIENT_CLINIC_OR_DEPARTMENT_OTHER): Payer: Medicaid Other | Attending: Cardiovascular Disease

## 2017-12-22 ENCOUNTER — Encounter: Payer: Self-pay | Admitting: Cardiovascular Disease

## 2017-12-22 ENCOUNTER — Encounter: Payer: Medicaid Other | Admitting: Cardiovascular Disease

## 2018-01-20 NOTE — Progress Notes (Signed)
This encounter was created in error - please disregard.

## 2018-03-13 ENCOUNTER — Other Ambulatory Visit (HOSPITAL_COMMUNITY): Payer: Self-pay | Admitting: Cardiovascular Disease

## 2018-03-13 NOTE — Telephone Encounter (Signed)
Rx request sent to pharmacy.  

## 2018-03-22 ENCOUNTER — Other Ambulatory Visit: Payer: Self-pay

## 2018-03-22 ENCOUNTER — Emergency Department (HOSPITAL_BASED_OUTPATIENT_CLINIC_OR_DEPARTMENT_OTHER): Payer: Medicaid Other

## 2018-03-22 ENCOUNTER — Emergency Department (HOSPITAL_BASED_OUTPATIENT_CLINIC_OR_DEPARTMENT_OTHER)
Admission: EM | Admit: 2018-03-22 | Discharge: 2018-03-23 | Disposition: A | Discharge status: Home / Self Care | Payer: Medicaid Other | Attending: Emergency Medicine | Admitting: Emergency Medicine

## 2018-03-22 ENCOUNTER — Encounter (HOSPITAL_BASED_OUTPATIENT_CLINIC_OR_DEPARTMENT_OTHER): Payer: Self-pay | Admitting: Emergency Medicine

## 2018-03-22 DIAGNOSIS — F17210 Nicotine dependence, cigarettes, uncomplicated: Secondary | ICD-10-CM | POA: Diagnosis not present

## 2018-03-22 DIAGNOSIS — I10 Essential (primary) hypertension: Secondary | ICD-10-CM | POA: Diagnosis not present

## 2018-03-22 DIAGNOSIS — J45909 Unspecified asthma, uncomplicated: Secondary | ICD-10-CM | POA: Diagnosis not present

## 2018-03-22 DIAGNOSIS — R519 Headache, unspecified: Secondary | ICD-10-CM

## 2018-03-22 DIAGNOSIS — R51 Headache: Secondary | ICD-10-CM | POA: Diagnosis not present

## 2018-03-22 DIAGNOSIS — Z79899 Other long term (current) drug therapy: Secondary | ICD-10-CM | POA: Insufficient documentation

## 2018-03-22 LAB — CBC WITH DIFFERENTIAL/PLATELET
Basophils Absolute: 0 10*3/uL (ref 0.0–0.1)
Basophils Relative: 0 %
Eosinophils Absolute: 0.3 10*3/uL (ref 0.0–0.7)
Eosinophils Relative: 5 %
HCT: 44.2 % (ref 39.0–52.0)
Hemoglobin: 14.8 g/dL (ref 13.0–17.0)
Lymphocytes Relative: 41 %
Lymphs Abs: 2.3 10*3/uL (ref 0.7–4.0)
MCH: 27.7 pg (ref 26.0–34.0)
MCHC: 33.5 g/dL (ref 30.0–36.0)
MCV: 82.8 fL (ref 78.0–100.0)
Monocytes Absolute: 0.5 10*3/uL (ref 0.1–1.0)
Monocytes Relative: 10 %
Neutro Abs: 2.5 10*3/uL (ref 1.7–7.7)
Neutrophils Relative %: 44 %
Platelets: 268 10*3/uL (ref 150–400)
RBC: 5.34 MIL/uL (ref 4.22–5.81)
RDW: 14.7 % (ref 11.5–15.5)
WBC: 5.6 10*3/uL (ref 4.0–10.5)

## 2018-03-22 LAB — COMPREHENSIVE METABOLIC PANEL
ALT: 57 U/L — ABNORMAL HIGH (ref 0–44)
AST: 28 U/L (ref 15–41)
Albumin: 4.3 g/dL (ref 3.5–5.0)
Alkaline Phosphatase: 91 U/L (ref 38–126)
Anion gap: 9 (ref 5–15)
BUN: 9 mg/dL (ref 6–20)
CO2: 31 mmol/L (ref 22–32)
Calcium: 9.4 mg/dL (ref 8.9–10.3)
Chloride: 100 mmol/L (ref 98–111)
Creatinine, Ser: 1.08 mg/dL (ref 0.61–1.24)
GFR calc Af Amer: >60 mL/min (ref >60)
GFR calc non Af Amer: >60 mL/min (ref >60)
Glucose, Bld: 94 mg/dL (ref 70–99)
Potassium: 3.6 mmol/L (ref 3.5–5.1)
Sodium: 140 mmol/L (ref 135–145)
Total Bilirubin: 0.3 mg/dL (ref 0.3–1.2)
Total Protein: 7.3 g/dL (ref 6.5–8.1)

## 2018-03-22 MED ORDER — ONDANSETRON HCL 4 MG/2ML IJ SOLN
4.0000 mg | Freq: Once | INTRAMUSCULAR | Status: AC
  Administered 2018-03-22: 4 mg via INTRAVENOUS
  Filled 2018-03-22: qty 2

## 2018-03-22 MED ORDER — METOCLOPRAMIDE HCL 5 MG/ML IJ SOLN
10.0000 mg | Freq: Once | INTRAMUSCULAR | Status: AC
  Administered 2018-03-22: 10 mg via INTRAVENOUS
  Filled 2018-03-22: qty 2

## 2018-03-22 MED ORDER — DILTIAZEM HCL ER COATED BEADS 120 MG PO CP24
120.0000 mg | ORAL_CAPSULE | Freq: Once | ORAL | Status: AC
  Administered 2018-03-22: 120 mg via ORAL
  Filled 2018-03-22: qty 1

## 2018-03-22 MED ORDER — DEXAMETHASONE SODIUM PHOSPHATE 10 MG/ML IJ SOLN
6.0000 mg | Freq: Once | INTRAMUSCULAR | Status: AC
  Administered 2018-03-22: 6 mg via INTRAVENOUS
  Filled 2018-03-22: qty 1

## 2018-03-22 MED ORDER — KETOROLAC TROMETHAMINE 30 MG/ML IJ SOLN
30.0000 mg | Freq: Once | INTRAMUSCULAR | Status: AC
  Administered 2018-03-22: 30 mg via INTRAVENOUS
  Filled 2018-03-22: qty 1

## 2018-03-22 MED ORDER — HYDRALAZINE HCL 20 MG/ML IJ SOLN
10.0000 mg | Freq: Once | INTRAMUSCULAR | Status: AC
  Administered 2018-03-22: 10 mg via INTRAVENOUS
  Filled 2018-03-22: qty 1

## 2018-03-22 MED ORDER — HYDROCHLOROTHIAZIDE 25 MG PO TABS
25.0000 mg | ORAL_TABLET | Freq: Once | ORAL | Status: AC
  Administered 2018-03-22: 25 mg via ORAL
  Filled 2018-03-22: qty 1

## 2018-03-22 MED ORDER — DIPHENHYDRAMINE HCL 50 MG/ML IJ SOLN
50.0000 mg | Freq: Once | INTRAMUSCULAR | Status: DC
  Filled 2018-03-22: qty 1

## 2018-03-22 MED ORDER — IOPAMIDOL (ISOVUE-370) INJECTION 76%
100.0000 mL | Freq: Once | INTRAVENOUS | Status: AC | PRN
  Administered 2018-03-22: 100 mL via INTRAVENOUS

## 2018-03-22 MED ORDER — PROMETHAZINE HCL 25 MG/ML IJ SOLN
25.0000 mg | Freq: Once | INTRAMUSCULAR | Status: DC
  Filled 2018-03-22: qty 1

## 2018-03-22 MED ORDER — SODIUM CHLORIDE 0.9 % IV BOLUS
1000.0000 mL | Freq: Once | INTRAVENOUS | Status: AC
  Administered 2018-03-22: 1000 mL via INTRAVENOUS

## 2018-03-22 NOTE — ED Provider Notes (Signed)
[facility] EMERGENCY DEPARTMENT Provider Note   CSN: [id number] Arrival date & time: 03/22/18  1755     History   Chief Complaint Chief Complaint  Patient presents with  . Headache    HPI Christian Schroeder is a 37 y.o. male with history of hypertension, uncontrolled, high cholesterol, atrial fibrillation status post ablation who presents with a 1 day history of headache.  Patient reports yesterday he had a pain in the back of his head that initially only came with coughing or sneezing.  This morning he woke up with a severe headache and had a constant throbbing pain that worsened with coughing or sneezing.  He reports he has history of high blood pressure, but only occasionally takes his father's blood pressure medication.  He does not have any blood pressure prescribed to him at this time.  He does take [place] for his atrial fibrillation.  He denies any sudden onset headache.  He has had some associated nausea and photophobia and blurry vision.  He denies any history of headaches.  He denies any fevers, numbness, weakness, vomiting, abdominal pain, chest pain, shortness of breath.  HPI  Past Medical History:  Diagnosis Date  . Asthma   . Atrial fibrillation (HCC)   . Essential hypertension 09/26/2017  . Hypertension     Patient Active Problem List   Diagnosis Date Noted  . Essential hypertension 09/26/2017  . Paroxysmal atrial fibrillation (HCC) 09/24/2017    Past Surgical History:  Procedure Laterality Date  . CARDIAC SURGERY          Home Medications    Prior to Admission medications   Medication Sig Start Date End Date Taking? Authorizing Provider  CARTIA XT 120 MG 24 hr capsule TAKE 1 CAPSULE BY MOUTH ONCE DAILY 03/13/18   Chilton Si, MD  diltiazem (CARDIZEM CD) 120 MG 24 hr capsule Take 1 capsule (120 mg total) by mouth daily. 05/21/17   Newman Nip, NP  fluticasone (FLONASE) 50 MCG/ACT nasal spray Place 2 sprays into both nostrils daily. 03/23/18    Enolia Koepke, Waylan Boga, PA-C  hydrochlorothiazide (HYDRODIURIL) 25 MG tablet Take 1 tablet (25 mg total) by mouth daily. 03/23/18   Ariona Deschene, Waylan Boga, PA-C  lisinopril (PRINIVIL,ZESTRIL) 10 MG tablet Take 1 tablet (10 mg total) by mouth daily. 03/23/18   Venita Seng, Waylan Boga, PA-C  sodium chloride (OCEAN) 0.65 % SOLN nasal spray Place 1 spray into both nostrils as needed for congestion. 03/23/18   Emi Holes, PA-C    Family History Family History  Problem Relation Age of Onset  . Kidney disease Mother   . Hypertension Mother   . Hypertension Father   . Kidney disease Brother     Social History Social History   Tobacco Use  . Smoking status: Current Every Day Smoker    Packs/day: 1.00    Types: Cigarettes  . Smokeless tobacco: Current User  Substance Use Topics  . Alcohol use: No  . Drug use: No     Allergies   Latex   Review of Systems Review of Systems  Constitutional: Negative for chills and fever.  HENT: Negative for facial swelling and sore throat.   Eyes: Positive for photophobia and visual disturbance.  Respiratory: Negative for shortness of breath.   Cardiovascular: Negative for chest pain.  Gastrointestinal: Positive for nausea. Negative for abdominal pain and vomiting.  Genitourinary: Negative for dysuria.  Musculoskeletal: Positive for neck pain. Negative for back pain.  Skin: Negative for rash and wound.  Neurological: Positive for headaches. Negative for weakness and numbness.  Psychiatric/Behavioral: The patient is not nervous/anxious.      Physical Exam Updated Vital Signs BP (!) 140/101   Pulse 87   Temp 99.6 F (37.6 C) (Oral)   Resp 18   Ht 6\' 4"  (1.93 m)   Wt 129.7 kg (286 lb)   SpO2 99%   BMI 34.81 kg/m   Physical Exam  Constitutional: He appears well-developed and well-nourished. No distress.  HENT:  Head: Normocephalic and atraumatic.  Mouth/Throat: Oropharynx is clear and moist. No oropharyngeal exudate.  Eyes: Pupils are equal, round, and  reactive to light. Conjunctivae are normal. Right eye exhibits no discharge. Left eye exhibits no discharge. No scleral icterus.  Neck: Normal range of motion. Neck supple. No neck rigidity. No thyromegaly present.  Cardiovascular: Normal rate, regular rhythm, normal heart sounds and intact distal pulses. Exam reveals no gallop and no friction rub.  No murmur heard. Pulmonary/Chest: Effort normal and breath sounds normal. No stridor. No respiratory distress. He has no wheezes. He has no rales.  Abdominal: Soft. Bowel sounds are normal. He exhibits no distension. There is no tenderness. There is no rebound and no guarding.  Musculoskeletal: He exhibits no edema.  Lymphadenopathy:    He has no cervical adenopathy.  Neurological: He is alert. Coordination normal. GCS eye subscore is 4. GCS verbal subscore is 5. GCS motor subscore is 6.  CN 3-12 intact; normal sensation throughout; 5/5 strength in all 4 extremities; equal bilateral grip strength  Skin: Skin is warm and dry. No rash noted. He is not diaphoretic. No pallor.  Psychiatric: He has a normal mood and affect.  Nursing note and vitals reviewed.    ED Treatments / Results  Labs (all labs ordered are listed, but only abnormal results are displayed) Labs Reviewed  COMPREHENSIVE METABOLIC PANEL - Abnormal; Notable for the following components:      Result Value   ALT 57 (*)    All other components within normal limits  CBC WITH DIFFERENTIAL/PLATELET    EKG None  Radiology Ct Angio Head W Or Wo Contrast  Result Date: 03/22/2018 CLINICAL DATA:  37 y/o M; severe headache, photosensitivity, and nausea for 2 days. EXAM: CT ANGIOGRAPHY HEAD AND NECK TECHNIQUE: Multidetector CT imaging of the head and neck was performed using the standard protocol during bolus administration of intravenous contrast. Multiplanar CT image reconstructions and MIPs were obtained to evaluate the vascular anatomy. Carotid stenosis measurements (when applicable)  are obtained utilizing NASCET criteria, using the distal internal carotid diameter as the denominator. CONTRAST:  100mL ISOVUE-370 IOPAMIDOL (ISOVUE-370) INJECTION 76% COMPARISON:  None. FINDINGS: CT HEAD FINDINGS Brain: No evidence of acute infarction, hemorrhage, hydrocephalus, extra-axial collection or mass lesion/mass effect. Vascular: No hyperdense vessel or unexpected calcification. Skull: Normal. Negative for fracture or focal lesion. Sinuses: Imaged portions are clear. Orbits: No acute finding. Review of the MIP images confirms the above findings CTA NECK FINDINGS Aortic arch: Standard branching. Imaged portion shows no evidence of aneurysm or dissection. No significant stenosis of the major arch vessel origins. Right carotid system: No evidence of dissection, stenosis (50% or greater) or occlusion. Left carotid system: No evidence of dissection, stenosis (50% or greater) or occlusion. Vertebral arteries: Codominant. No evidence of dissection, stenosis (50% or greater) or occlusion. Skeleton: Negative. Other neck: Negative. Upper chest: Negative. Review of the MIP images confirms the above findings CTA HEAD FINDINGS Anterior circulation: No significant stenosis, proximal occlusion, aneurysm, or vascular malformation. Posterior  circulation: No significant stenosis, proximal occlusion, aneurysm, or vascular malformation. Venous sinuses: As permitted by contrast timing, patent. Anatomic variants: None significant Delayed phase: No abnormal intracranial enhancement. Review of the MIP images confirms the above findings IMPRESSION: 1. No acute intracranial process or abnormal enhancement of the brain identified. Unremarkable CT of the head. 2. Patent carotid and vertebral arteries. No dissection, aneurysm, or hemodynamically significant stenosis utilizing NASCET criteria. 3. Patent anterior and posterior intracranial circulation. No large vessel occlusion, aneurysm, or focal high-grade stenosis. Electronically  Signed   By: Mitzi Hansen M.D.   On: 03/22/2018 22:21   Ct Angio Neck W And/or Wo Contrast  Result Date: 03/22/2018 CLINICAL DATA:  37 y/o M; severe headache, photosensitivity, and nausea for 2 days. EXAM: CT ANGIOGRAPHY HEAD AND NECK TECHNIQUE: Multidetector CT imaging of the head and neck was performed using the standard protocol during bolus administration of intravenous contrast. Multiplanar CT image reconstructions and MIPs were obtained to evaluate the vascular anatomy. Carotid stenosis measurements (when applicable) are obtained utilizing NASCET criteria, using the distal internal carotid diameter as the denominator. CONTRAST:  ISOVUE-370 IOPAMIDOL (ISOVUE-370) INJECTION 76% COMPARISON:  None. FINDINGS: CT HEAD FINDINGS Brain: No evidence of acute infarction, hemorrhage, hydrocephalus, extra-axial collection or mass lesion/mass effect. Vascular: No hyperdense vessel or unexpected calcification. Skull: Normal. Negative for fracture or focal lesion. Sinuses: Imaged portions are clear. Orbits: No acute finding. Review of the MIP images confirms the above findings CTA NECK FINDINGS Aortic arch: Standard branching. Imaged portion shows no evidence of aneurysm or dissection. No significant stenosis of the major arch vessel origins. Right carotid system: No evidence of dissection, stenosis (50% or greater) or occlusion. Left carotid system: No evidence of dissection, stenosis (50% or greater) or occlusion. Vertebral arteries: Codominant. No evidence of dissection, stenosis (50% or greater) or occlusion. Skeleton: Negative. Other neck: Negative. Upper chest: Negative. Review of the MIP images confirms the above findings CTA HEAD FINDINGS Anterior circulation: No significant stenosis, proximal occlusion, aneurysm, or vascular malformation. Posterior circulation: No significant stenosis, proximal occlusion, aneurysm, or vascular malformation. Venous sinuses: As permitted by contrast timing, patent.  Anatomic variants: None significant Delayed phase: No abnormal intracranial enhancement. Review of the MIP images confirms the above findings IMPRESSION: 1. No acute intracranial process or abnormal enhancement of the brain identified. Unremarkable CT of the head. 2. Patent carotid and vertebral arteries. No dissection, aneurysm, or hemodynamically significant stenosis utilizing NASCET criteria. 3. Patent anterior and posterior intracranial circulation. No large vessel occlusion, aneurysm, or focal high-grade stenosis. Electronically Signed   By: Mitzi Hansen M.D.   On: 03/22/2018 22:21    Procedures Procedures (including critical care time)  Medications Ordered in ED Medications  diphenhydrAMINE (BENADRYL) injection 50 mg (50 mg Intravenous Not Given 03/22/18 2314)  promethazine (PHENERGAN) injection 25 mg (25 mg Intravenous Not Given 03/22/18 2314)  sodium chloride 0.9 % bolus 1,000 mL (0 mLs Intravenous Stopped 03/22/18 2046)  metoCLOPramide (REGLAN) injection 10 mg (10 mg Intravenous Given 03/22/18 1957)  hydrALAZINE (APRESOLINE) injection 10 mg (10 mg Intravenous Given 03/22/18 2042)  dexamethasone (DECADRON) injection 6 mg (6 mg Intravenous Given 03/22/18 2044)  iopamidol (ISOVUE-370) 76 % injection 100 mL (100 mLs Intravenous Contrast Given 03/22/18 2158)  ondansetron (ZOFRAN) injection 4 mg (4 mg Intravenous Given 03/22/18 2201)  ketorolac (TORADOL) 30 MG/ML injection 30 mg (30 mg Intravenous Given 03/22/18 2310)  hydrochlorothiazide (HYDRODIURIL) tablet 25 mg (25 mg Oral Given 03/22/18 2312)  diltiazem (CARDIZEM CD) 24 hr capsule 120  mg (120 mg Oral Given 03/22/18 2312)     Initial Impression / Assessment and Plan / ED Course  I have reviewed the triage vital signs and the nursing notes.  Pertinent labs & imaging results that were available during my care of the patient were reviewed by me and considered in my medical decision making (see chart for details).     Patient presenting with  headache in setting of hypertension.  Patient's headache and blood pressure treated in the ED and his symptoms are improving.  Labs are unremarkable.  CT angio head and neck are unremarkable.  Patient remains afebrile throughout ED course.  Low suspicion for meningitis.  Patient can range neck without difficulty.  Low suspicion for [facility] as patient's symptoms were gradual onset.  Normal neuro exam without focal deficits.  Patient blood pressure is decreasing.  Will initiate HCTZ and lisinopril daily as patient has only been taking it intermittently and have not been prescribed to him.  Patient advised to follow-up with his primary care provider and/or cardiologist for further management of his blood pressure.  I have also advised him to speak with his primary care provider about today's visit and his headache.  Strict return precautions given.  Patient understands and agrees with plan.  Patient stable throughout ED course and discharged in satisfactory condition. I discussed patient case with Dr. Silverio Lay who guided the patient's management and agrees with plan.   Final Clinical Impressions(s) / ED Diagnoses   Final diagnoses:  Essential hypertension  Bad headache    ED Discharge Orders        Ordered    hydrochlorothiazide (HYDRODIURIL) 25 MG tablet  Daily     03/23/18 0008    lisinopril (PRINIVIL,ZESTRIL) 10 MG tablet  Daily     03/23/18 0008    fluticasone (FLONASE) 50 MCG/ACT nasal spray  Daily     03/23/18 0008    sodium chloride (OCEAN) 0.65 % SOLN nasal spray  As needed     03/23/18 0008       Emi Holes, PA-C 03/23/18 0011    Charlynne Pander, MD 03/24/18 [phone]

## 2018-03-22 NOTE — ED Triage Notes (Signed)
Headache since yesterday with photophobia and nausea.

## 2018-03-22 NOTE — ED Notes (Signed)
PT taken to CTA with RN accompanyment. PT began to feel nauseated ans shaky while in CTA. Trinna PostAlex, PA notified. Zofran given.

## 2018-03-23 MED ORDER — SALINE SPRAY 0.65 % NA SOLN: 1.0000 | NASAL | 0 refills | Status: DC | PRN

## 2018-03-23 MED ORDER — HYDROCHLOROTHIAZIDE 25 MG PO TABS: 25.0000 mg | ORAL_TABLET | Freq: Every day | ORAL | 0 refills | Status: DC

## 2018-03-23 MED ORDER — FLUTICASONE PROPIONATE 50 MCG/ACT NA SUSP: 2.0000 | Freq: Every day | NASAL | 0 refills | Status: DC

## 2018-03-23 MED ORDER — LISINOPRIL 10 MG PO TABS: 10.0000 mg | ORAL_TABLET | Freq: Every day | ORAL | 0 refills | Status: DC

## 2018-03-23 NOTE — Discharge Instructions (Signed)
Begin taking hydrochlorothiazide and lisinopril daily for your blood pressure.  You can use Flonase and nasal saline as prescribed for your nasal congestion.  Please follow-up with your cardiologist or primary care provider for further evaluation and management of your blood pressure.  Please talk to your primary care doctor about your visit today for further work-up of your headache if you continue to have them.  Please return to emergency department immediately if you develop any new or worsening symptoms.

## 2018-06-08 ENCOUNTER — Other Ambulatory Visit (HOSPITAL_COMMUNITY): Payer: Self-pay | Admitting: Cardiovascular Disease

## 2018-06-08 NOTE — Telephone Encounter (Signed)
Rx request sent to pharmacy.  

## 2018-07-14 ENCOUNTER — Telehealth: Payer: Self-pay | Admitting: Cardiovascular Disease

## 2018-07-14 MED ORDER — DILTIAZEM HCL ER COATED BEADS 120 MG PO CP24: 120.0000 mg | ORAL_CAPSULE | Freq: Every day | ORAL | 0 refills | Status: DC

## 2018-07-14 NOTE — Telephone Encounter (Signed)
New message     *STAT* If patient is at the pharmacy, call can be transferred to refill team.   1. Which medications need to be refilled? (please list name of each medication and dose if known) cartia  2. Which pharmacy/location (including street and city if local pharmacy) is medication to be sent to? [facility] on [address]   3. Do they need a 30 day or 90 day supply? 30 day

## 2018-07-30 ENCOUNTER — Encounter: Payer: Self-pay | Admitting: Cardiovascular Disease

## 2018-07-30 ENCOUNTER — Ambulatory Visit (INDEPENDENT_AMBULATORY_CARE_PROVIDER_SITE_OTHER): Payer: Medicaid Other | Admitting: Cardiovascular Disease

## 2018-07-30 VITALS — BP 124/74 | HR 73 | Ht 76.0 in | Wt 299.0 lb

## 2018-07-30 DIAGNOSIS — I10 Essential (primary) hypertension: Secondary | ICD-10-CM

## 2018-07-30 DIAGNOSIS — I480 Paroxysmal atrial fibrillation: Secondary | ICD-10-CM

## 2018-07-30 NOTE — Progress Notes (Signed)
Cardiology Office Note   Date:  08/07/2018   ID:  Fredda HammedFranco Mosher, DOB [birth year], MRN [id number]  PCP:  Patient, No Pcp Per  Cardiologist:   Chilton Siiffany [place], MD   No chief complaint on file.    History of Present Illness: Fredda HammedFranco Andreason is a 37 y.o. male with asthma and paroxysmal atrial fibrillation here for follow up.  He has a history of atrial fibrillation and underwent TEE/DCCV at age 37.  He subsequently underwent ablation while living in [place].  He was awakened from sleep 04/2017 with recurrent atrial fibrillation.  He was seen in the ED and his heart rate was 193 bpm.  When in atrial fibrillation he felt like his heart was racing and it was difficult to breathe.  He underwent DCCV and followed up in atrial fibrillation clinic 04/2017.  He was started on diltiazem and given a 30-day prescription of Eliquis.  He had an echo 04/2017 that revealed LVEF 55-60% and mildly dilated left atrium.  He was also referred for sleep study but has not yet had it.  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  He cancelled the sleep study 3 times due to work obligations.  Since his last appointment Mr. Earlene PlaterDavis quit smoking.  He hasn't smoked in over two months.  Since then he gained 25lb.  He is a single father and cares for his daughter.  He is trying to be healthier for her.  He plans to start a sugar fast next year.  He has been working out at night 3 nights per week.  He lifts weights, does aerobics and walks on the treadmill.  He gets tired but has no chest pain or shortness of breath. His BP at home has mostly been around 140 systolic.  He denies lower extremity edema, orthopnea or PND.    Past Medical History:  Diagnosis Date  . Asthma   . Atrial fibrillation (HCC)   . Essential hypertension 09/26/2017  . Hypertension     Past Surgical History:  Procedure Laterality Date  . CARDIAC SURGERY       Current Outpatient Medications  Medication Sig Dispense Refill  .  diltiazem (CARTIA XT) 120 MG 24 hr capsule Take 1 capsule (120 mg total) by mouth daily. PT OVERDUE FOR OV PLEASE CALL FOR APPT 30 capsule 0  . hydrochlorothiazide (HYDRODIURIL) 25 MG tablet Take 25 mg by mouth daily. AS NEEDED FOR BLOOD PRESSURE    . lisinopril (PRINIVIL,ZESTRIL) 10 MG tablet Take 10 mg by mouth daily. AS NEEDED FOR BLOOD PRESSURE     No current facility-administered medications for this visit.     Allergies:   Latex    Social History:  The patient  reports that he quit smoking about 2 months ago. His smoking use included cigarettes. He smoked 1.00 pack per day. He uses smokeless tobacco. He reports that he does not drink alcohol or use drugs.   Family History:  The patient's family history includes Hypertension in his father and mother; Kidney disease in his brother and mother.    ROS:  Please see the history of present illness.   Otherwise, review of systems are positive for back pain, anxiety, weight gain.   All other systems are reviewed and negative.    PHYSICAL EXAM: VS:  BP 124/74   Pulse 73   Ht 6\' 4"  (1.93 m)   Wt 299 lb (135.6 kg)   BMI 36.40 kg/m  , BMI  Body mass index is 36.4 kg/m. GENERAL:  Well appearing.  No acute distress. HEENT:  Pupils equal round and reactive, fundi not visualized, oral mucosa unremarkable NECK:  No jugular venous distention, waveform within normal limits, carotid upstroke brisk and symmetric, no bruits, no thyromegaly LUNGS:  Clear to auscultation bilaterally HEART:  RRR.  PMI not displaced or sustained,S1 and S2 within normal limits, no S3, no S4, no clicks, no rubs, no murmurs ABD:  Flat, positive bowel sounds normal in frequency in pitch, no bruits, no rebound, no guarding, no midline pulsatile mass, no hepatomegaly, no splenomegaly EXT:  2 plus pulses throughout, no edema, no cyanosis no clubbing SKIN:  No rashes no nodules NEURO:  Cranial nerves II through XII grossly intact, motor grossly intact throughout PSYCH:   Cognitively intact, oriented to person place and time   EKG:  EKG is not ordered today. The ekg ordered 09/24/17 demonstrates sinus rhythm.  Rate 69 bpm.  LVH with secondary repolarization abnormality  Echo 05/09/17: Study Conclusions  - Left ventricle: GLSS is normal at -20% The cavity size was   normal. There was moderate concentric hypertrophy. Systolic   function was normal. The estimated ejection fraction was in the   range of 55% to 60%. Wall motion was normal; there were no   regional wall motion abnormalities. Left ventricular diastolic   function parameters were normal. - Left atrium: The atrium was mildly dilated  Recent Labs: 09/24/2017: Magnesium 2.1; TSH 0.570 03/22/2018: ALT 57; BUN 9; Creatinine, Ser 1.08; Hemoglobin 14.8; Platelets 268; Potassium 3.6; Sodium 140    Lipid Panel No results found for: CHOL, TRIG, HDL, CHOLHDL, VLDL, LDLCALC, LDLDIRECT    Wt Readings from Last 3 Encounters:  07/30/18 299 lb (135.6 kg)  03/22/18 286 lb (129.7 kg)  09/24/17 294 lb (133.4 kg)      ASSESSMENT AND PLAN:  # Paroxysmal atrial fibrillation:  Mr. Hosking has remained in sinus rhythm since his cardioversion.  He is doing well.  Continue diltiazem.  He is not on anticoagulation due to CHA2DS2-Vasc score of 0.  Given that his BP is elevated, we would now consider him to have a score of 1.  He would like to try to get his BP down before starting any new medications.  Will discuss at follow up. Sleep study still pending.   # Hypertension: Mr. Dinapoli' blood pressure is much better.  Continue diltiazem, HCTZ and lisinopril.  He was congratulated on his exercise routine.  # Tobacco abuse: Mr. Vanalstyne was congratulated on 2 months of abstaining from tobacco.        Current medicines are reviewed at length with the patient today.  The patient does not have concerns regarding medicines.  The following changes have been made:  no change  Labs/ tests ordered today include:   No orders of  the defined types were placed in this encounter.    Disposition:   FU with Korbyn Vanes C. Duke Salvia, MD, [facility] in 3 months.       Signed, Emmali Karow C. Duke Salvia, MD, [facility]  08/07/2018 1:58 PM    [facility]

## 2018-07-30 NOTE — Patient Instructions (Addendum)
Medication Instructions:  Your physician recommends that you continue on your current medications as directed. Please refer to the Current Medication list given to you today.  If you need a refill on your cardiac medications before your next appointment, please call your pharmacy.   Lab work: NONE  Testing/Procedures: NONE  Follow-Up: 11/10/18 AT 2:20  Any Other Special Instructions Will Be Listed Below (If Applicable). MONITOR AND LOG YOUR BLOOD PRESSURE, BRING WITH YOU TO YOUR FOLLOW UP

## 2018-08-15 ENCOUNTER — Other Ambulatory Visit: Payer: Self-pay | Admitting: Cardiovascular Disease

## 2018-09-18 ENCOUNTER — Other Ambulatory Visit: Payer: Self-pay

## 2018-09-18 MED ORDER — HYDROCHLOROTHIAZIDE 25 MG PO TABS: 25.0000 mg | ORAL_TABLET | Freq: Every day | ORAL | 3 refills | Status: DC

## 2018-09-18 NOTE — Telephone Encounter (Signed)
Rx(s) sent to pharmacy electronically.  

## 2018-09-22 ENCOUNTER — Other Ambulatory Visit: Payer: Self-pay | Admitting: Cardiovascular Disease

## 2018-09-22 MED ORDER — LISINOPRIL 10 MG PO TABS: 10.0000 mg | ORAL_TABLET | Freq: Every day | ORAL | 1 refills | Status: DC

## 2018-09-22 NOTE — Telephone Encounter (Signed)
 *  STAT* If patient is at the pharmacy, call can be transferred to refill team.   1. Which medications need to be refilled? (please list name of each medication and dose if known)  lisinopril (PRINIVIL,ZESTRIL) 10 MG tablet  2. Which pharmacy/location (including street and city if local pharmacy) is medication to be sent to? [facility] [address]  3. Do they need a 30 day or 90 day supply? 30  Patient states that Dr Duke Salvia never called this medication in to the pharmacy.

## 2018-11-06 ENCOUNTER — Telehealth: Payer: Self-pay | Admitting: *Deleted

## 2018-11-06 NOTE — Telephone Encounter (Signed)
Spoke with patient and discussed the COVID 19 situation and the need to possibly reschedule patient. Patient stated he was doing fine and that he was not trying to be rude however he was on the other line on business call. He said to just send him an appointment he was doing fine. Will forward to pool to reschedule

## 2018-11-10 ENCOUNTER — Ambulatory Visit: Payer: Medicaid Other | Admitting: Cardiovascular Disease

## 2018-11-23 ENCOUNTER — Telehealth: Payer: Self-pay | Admitting: *Deleted

## 2018-11-23 NOTE — Telephone Encounter (Signed)
Left message to call back regarding appointment that had been canceled secondary to COVID 19. Will offer patient virtual visit.

## 2018-11-27 NOTE — Telephone Encounter (Signed)
Regis Bill B, LPN at 09/19/5870 12:55 PM   Status: Signed    Left message to call back regarding appointment that had been canceled secondary to COVID 19. Will offer patient virtual visit.

## 2018-12-22 ENCOUNTER — Other Ambulatory Visit: Payer: Self-pay | Admitting: Cardiovascular Disease

## 2018-12-22 NOTE — Telephone Encounter (Signed)
CartiaXT 120 mg refilled.

## 2019-01-19 ENCOUNTER — Other Ambulatory Visit: Payer: Self-pay | Admitting: Cardiovascular Disease

## 2019-04-07 ENCOUNTER — Telehealth: Payer: Self-pay | Admitting: Cardiovascular Disease

## 2019-04-07 NOTE — Telephone Encounter (Signed)
Returned call to pt he states that he wants to go back to the gym-[employer] and he states that he needs a letter stating that in order to be heart healthy for his heart he needs to exercise at least 30 minutes a day. I sent him an activation code so once we have the letter he can access this letter and print it out to take to the [facility].

## 2019-04-07 NOTE — Telephone Encounter (Signed)
New Message   Patient is calling to request a note to return to the gym to work out. He states that the [employer] is asking for a letter from the doctor. Please call

## 2019-04-12 ENCOUNTER — Encounter: Payer: Self-pay | Admitting: *Deleted

## 2019-04-12 NOTE — Telephone Encounter (Signed)
OK Per Dr [name]

## 2019-04-12 NOTE — Telephone Encounter (Signed)
Letter done and mailed to patient 

## 2019-07-19 ENCOUNTER — Other Ambulatory Visit (HOSPITAL_COMMUNITY): Payer: Self-pay | Admitting: Cardiovascular Disease

## 2019-07-21 ENCOUNTER — Other Ambulatory Visit: Payer: Self-pay

## 2019-07-21 MED ORDER — DILTIAZEM HCL ER COATED BEADS 120 MG PO CP24: 120.0000 mg | ORAL_CAPSULE | Freq: Every day | ORAL | 0 refills | Status: DC

## 2019-09-09 ENCOUNTER — Other Ambulatory Visit: Payer: Self-pay

## 2019-09-09 ENCOUNTER — Encounter (HOSPITAL_BASED_OUTPATIENT_CLINIC_OR_DEPARTMENT_OTHER): Payer: Self-pay

## 2019-09-09 ENCOUNTER — Emergency Department (HOSPITAL_BASED_OUTPATIENT_CLINIC_OR_DEPARTMENT_OTHER)
Admission: EM | Admit: 2019-09-09 | Discharge: 2019-09-09 | Disposition: A | Discharge status: Home / Self Care | Payer: Medicaid Other | Attending: Emergency Medicine | Admitting: Emergency Medicine

## 2019-09-09 DIAGNOSIS — Z79899 Other long term (current) drug therapy: Secondary | ICD-10-CM | POA: Diagnosis not present

## 2019-09-09 DIAGNOSIS — J45909 Unspecified asthma, uncomplicated: Secondary | ICD-10-CM | POA: Diagnosis not present

## 2019-09-09 DIAGNOSIS — Z87891 Personal history of nicotine dependence: Secondary | ICD-10-CM | POA: Diagnosis not present

## 2019-09-09 DIAGNOSIS — I10 Essential (primary) hypertension: Secondary | ICD-10-CM | POA: Insufficient documentation

## 2019-09-09 DIAGNOSIS — I4891 Unspecified atrial fibrillation: Secondary | ICD-10-CM | POA: Diagnosis not present

## 2019-09-09 DIAGNOSIS — R002 Palpitations: Secondary | ICD-10-CM | POA: Diagnosis present

## 2019-09-09 LAB — CBC WITH DIFFERENTIAL/PLATELET
Abs Immature Granulocytes: 0.02 10*3/uL (ref 0.00–0.07)
Basophils Absolute: 0 10*3/uL (ref 0.0–0.1)
Basophils Relative: 1 %
Eosinophils Absolute: 0.2 10*3/uL (ref 0.0–0.5)
Eosinophils Relative: 2 %
HCT: 46.4 % (ref 39.0–52.0)
Hemoglobin: 15.1 g/dL (ref 13.0–17.0)
Immature Granulocytes: 0 %
Lymphocytes Relative: 37 %
Lymphs Abs: 3.2 10*3/uL (ref 0.7–4.0)
MCH: 27.8 pg (ref 26.0–34.0)
MCHC: 32.5 g/dL (ref 30.0–36.0)
MCV: 85.5 fL (ref 80.0–100.0)
Monocytes Absolute: 0.7 10*3/uL (ref 0.1–1.0)
Monocytes Relative: 8 %
Neutro Abs: 4.4 10*3/uL (ref 1.7–7.7)
Neutrophils Relative %: 52 %
Platelets: 346 10*3/uL (ref 150–400)
RBC: 5.43 MIL/uL (ref 4.22–5.81)
RDW: 14.5 % (ref 11.5–15.5)
WBC: 8.5 10*3/uL (ref 4.0–10.5)
nRBC: 0 % (ref 0.0–0.2)

## 2019-09-09 LAB — BASIC METABOLIC PANEL
Anion gap: 8 (ref 5–15)
BUN: 16 mg/dL (ref 6–20)
CO2: 26 mmol/L (ref 22–32)
Calcium: 9.7 mg/dL (ref 8.9–10.3)
Chloride: 104 mmol/L (ref 98–111)
Creatinine, Ser: 1.02 mg/dL (ref 0.61–1.24)
GFR calc Af Amer: >60 mL/min (ref >60)
GFR calc non Af Amer: >60 mL/min (ref >60)
Glucose, Bld: 140 mg/dL — ABNORMAL HIGH (ref 70–99)
Potassium: 3.5 mmol/L (ref 3.5–5.1)
Sodium: 138 mmol/L (ref 135–145)

## 2019-09-09 LAB — MAGNESIUM: Magnesium: 2.2 mg/dL (ref 1.7–2.4)

## 2019-09-09 MED ORDER — DILTIAZEM LOAD VIA INFUSION
20.0000 mg | Freq: Once | INTRAVENOUS | Status: AC
  Filled 2019-09-09: qty 20

## 2019-09-09 MED ORDER — FENTANYL CITRATE (PF) 100 MCG/2ML IJ SOLN
50.0000 ug | Freq: Once | INTRAMUSCULAR | Status: AC
  Administered 2019-09-09: 50 ug via INTRAVENOUS
  Filled 2019-09-09: qty 2

## 2019-09-09 MED ORDER — DILTIAZEM HCL-DEXTROSE 125-5 MG/125ML-% IV SOLN (PREMIX)
INTRAVENOUS | Status: AC
  Administered 2019-09-09: 20 mg via INTRAVENOUS
  Filled 2019-09-09: qty 125

## 2019-09-09 MED ORDER — DILTIAZEM HCL-DEXTROSE 125-5 MG/125ML-% IV SOLN (PREMIX)
5.0000 mg/h | INTRAVENOUS | Status: DC
  Administered 2019-09-09: 5 mg/h via INTRAVENOUS

## 2019-09-09 MED ORDER — PROPOFOL 10 MG/ML IV BOLUS
60.0000 mg | Freq: Once | INTRAVENOUS | Status: AC
  Administered 2019-09-09: 60 mg via INTRAVENOUS
  Filled 2019-09-09: qty 20

## 2019-09-09 NOTE — Sedation Documentation (Signed)
Consent signed and procedure performed by Dr Raeford Razor.

## 2019-09-09 NOTE — Sedation Documentation (Signed)
Patient cardioverted with 120 joules; patient tolerated well. Repeat EKG completed; SR noted on monitor at rate of 70. Patient responding to verbal stimuli. NAD noted.

## 2019-09-09 NOTE — ED Notes (Signed)
Pt given phone charger cord.

## 2019-09-09 NOTE — ED Provider Notes (Signed)
[facility] EMERGENCY DEPARTMENT Provider Note   CSN: [id number] Arrival date & time: 09/09/19  2119     History Chief Complaint  Patient presents with  . Palpitations    Christian Schroeder is a 39 y.o. male.  HPI    39 year old male with palpitations and shortness of breath.  Onset about an hour and half prior to arrival.  Symptoms occurred while at rest.  He has a past history of atrial fibrillation.  He reports that he is not always compliant with his medications.  He is not take him in a couple days.  He denies any pain.  Feels mildly short of breath.  He tried resting at home.  Deep breathing.  Vagal maneuvers.  None of this helped symptoms so he came to the emergency room.  He follows with Dr. Oval Schroeder, cardiology.  Past Medical History:  Diagnosis Date  . Asthma   . Atrial fibrillation ([place])   . Essential hypertension 09/26/2017  . Hypertension     Patient Active Problem List   Diagnosis Date Noted  . Essential hypertension 09/26/2017  . Paroxysmal atrial fibrillation ([place]) 09/24/2017    Past Surgical History:  Procedure Laterality Date  . CARDIAC SURGERY     cardioversion       Family History  Problem Relation Age of Onset  . Kidney disease Mother   . Hypertension Mother   . Hypertension Father   . Kidney disease Brother     Social History   Tobacco Use  . Smoking status: Former Smoker    Packs/day: 1.00    Types: Cigarettes    Quit date: 05/30/2018    Years since quitting: 1.2  . Smokeless tobacco: Current User  Substance Use Topics  . Alcohol use: Yes    Comment: occ  . Drug use: Not Currently    Types: Marijuana    Home Medications Prior to Admission medications   Medication Sig Start Date End Date Taking? Authorizing Provider  diltiazem (CARDIZEM CD) 120 MG 24 hr capsule Take 1 capsule (120 mg total) by mouth daily. 07/21/19   Christian Colonel, NP  hydrochlorothiazide (HYDRODIURIL) 25 MG tablet Take 1 tablet (25 mg total) by mouth  daily. AS NEEDED FOR BLOOD PRESSURE 09/18/18   Skeet Latch, MD  lisinopril (ZESTRIL) 10 MG tablet TAKE 1 TABLET BY MOUTH ONCE DAILY AS NEEDED FOR BLOOD PRESSURE 01/19/19   Skeet Latch, MD    Allergies    Latex  Review of Systems   Review of Systems All systems reviewed and negative, other than as noted in HPI.  Physical Exam Updated Vital Signs BP 127/88 (BP Location: Right Arm)   Pulse 70   Temp (!) 97.5 F (36.4 C) (Oral)   Resp 14   Ht 6\' 5"  (1.956 m)   Wt 131.5 kg   SpO2 100%   BMI 34.39 kg/m   Physical Exam Vitals and nursing note reviewed.  Constitutional:      Appearance: He is well-developed. He is obese.     Comments: Appears uncomfortable  HENT:     Head: Normocephalic and atraumatic.  Eyes:     General:        Right eye: No discharge.        Left eye: No discharge.     Conjunctiva/sclera: Conjunctivae normal.  Cardiovascular:     Rate and Rhythm: Tachycardia present. Rhythm irregular.     Heart sounds: Normal heart sounds. No murmur. No friction rub. No gallop.  Pulmonary:     Effort: Pulmonary effort is normal. No respiratory distress.     Breath sounds: Normal breath sounds.  Abdominal:     General: There is no distension.     Palpations: Abdomen is soft.     Tenderness: There is no abdominal tenderness.  Musculoskeletal:        General: No tenderness.     Cervical back: Neck supple.  Skin:    General: Skin is warm and dry.  Neurological:     Mental Status: He is alert.  Psychiatric:        Behavior: Behavior normal.        Thought Content: Thought content normal.     ED Results / Procedures / Treatments   Labs (all labs ordered are listed, but only abnormal results are displayed) Labs Reviewed  BASIC METABOLIC PANEL - Abnormal; Notable for the following components:      Result Value   Glucose, Bld 140 (*)    All other components within normal limits  CBC WITH DIFFERENTIAL/PLATELET  MAGNESIUM    EKG EKG  Interpretation  Date/Time:  Thursday September 09 2019 18:55:49 EST Ventricular Rate:  163 PR Interval:    QRS Duration: 98 QT Interval:  282 QTC Calculation: 464 R Axis:   95 Text Interpretation: Atrial fibrillation with rapid ventricular response Rightward axis Minimal voltage criteria for LVH, may be normal variant ( Sokolow-Lyon ) ST & T wave abnormality, consider inferolateral ischemia Abnormal ECG Confirmed by Raeford Razor [phone]) on 09/09/2019 10:25:41 PM   Radiology No results found.  Procedures .Cardioversion  Date/Time: 09/09/2019 9:00 PM Performed by: Raeford Razor, MD Authorized by: Raeford Razor, MD   Consent:    Consent obtained:  Verbal and written   Consent given by:  Patient   Risks discussed:  Induced arrhythmia and pain   Alternatives discussed:  Rate-control medication and delayed treatment Pre-procedure details:    Cardioversion basis:  Elective   Rhythm:  Atrial fibrillation   Electrode placement:  Anterior-posterior Patient sedated: Yes. Refer to sedation procedure documentation for details of sedation.  Attempt one:    Cardioversion mode:  Synchronous   Waveform:  Biphasic   Shock (Joules):  120   Shock outcome:  Conversion to normal sinus rhythm Post-procedure details:    Patient status:  Awake   Patient tolerance of procedure:  Tolerated well, no immediate complications .Sedation  Date/Time: 09/09/2019 9:00 PM Performed by: Raeford Razor, MD Authorized by: Raeford Razor, MD   Consent:    Consent obtained:  Verbal   Consent given by:  Patient   Risks discussed:  Allergic reaction, dysrhythmia, inadequate sedation, nausea, prolonged hypoxia resulting in organ damage, prolonged sedation necessitating reversal, respiratory compromise necessitating ventilatory assistance and intubation and vomiting   Alternatives discussed:  Analgesia without sedation, anxiolysis and regional anesthesia Universal protocol:    Procedure explained and questions  answered to patient or proxy's satisfaction: yes     Relevant documents present and verified: yes     Test results available and properly labeled: yes     Imaging studies available: yes     Required blood products, implants, devices, and special equipment available: yes     Site/side marked: yes     Immediately prior to procedure a time out was called: yes     Patient identity confirmation method:  Verbally with patient Indications:    Procedure necessitating sedation performed by:  Physician performing sedation Pre-sedation assessment:    Time since last food  or drink:  .   ASA classification: class 2 - patient with mild systemic disease     Neck mobility: normal     Mouth opening:  3 or more finger widths   Thyromental distance:  3 finger widths   Mallampati score:  II - soft palate, uvula, fauces visible   Pre-sedation assessments completed and reviewed: airway patency, cardiovascular function, hydration status, mental status, nausea/vomiting, pain level, respiratory function and temperature   Immediate pre-procedure details:    Reassessment: Patient reassessed immediately prior to procedure     Reviewed: vital signs, relevant labs/tests and NPO status     Verified: bag valve mask available, emergency equipment available, intubation equipment available, IV patency confirmed, oxygen available and suction available   Procedure details (see MAR for exact dosages):    Preoxygenation:  Nasal cannula   Sedation:  Propofol   Intended level of sedation: deep   Analgesia:  Fentanyl   Intra-procedure monitoring:  Blood pressure monitoring, cardiac monitor, continuous pulse oximetry, frequent LOC assessments, frequent vital sign checks and continuous capnometry   Intra-procedure events: none     Total Provider sedation time (minutes):  30 Post-procedure details:    Attendance: Constant attendance by certified staff until patient recovered     Recovery: Patient returned to pre-procedure  baseline     Post-sedation assessments completed and reviewed: airway patency, cardiovascular function, hydration status, mental status, nausea/vomiting, pain level, respiratory function and temperature     Patient is stable for discharge or admission: yes     Patient tolerance:  Tolerated well, no immediate complications    CRITICAL CARE Performed by: Raeford Razor Total critical care time: 35 minutes Critical care time was exclusive of separately billable procedures and treating other patients. Critical care was necessary to treat or prevent imminent or life-threatening deterioration. Critical care was time spent personally by me on the following activities: development of treatment plan with patient and/or surrogate as well as nursing, discussions with consultants, evaluation of patient's response to treatment, examination of patient, obtaining history from patient or surrogate, ordering and performing treatments and interventions, ordering and review of laboratory studies, ordering and review of radiographic studies, pulse oximetry and re-evaluation of patient's condition.   Medications Ordered in ED Medications  diltiazem (CARDIZEM) 1 mg/mL load via infusion 20 mg (20 mg Intravenous Bolus from Bag 09/09/19 1913)    And  diltiazem (CARDIZEM) 125 mg in dextrose 5% 125 mL (1 mg/mL) infusion (0 mg/hr Intravenous Stopped 09/09/19 2038)  propofol (DIPRIVAN) 10 mg/mL bolus/IV push 60 mg (60 mg Intravenous Given 09/09/19 2056)  fentaNYL (SUBLIMAZE) injection 50 mcg (50 mcg Intravenous Given 09/09/19 2051)    ED Course  I have reviewed the triage vital signs and the nursing notes.  Pertinent labs & imaging results that were available during my care of the patient were reviewed by me and considered in my medical decision making (see chart for details).    MDM Rules/Calculators/A&P  39 year old male with atrial fibrillation.  Arrived with rapid ventricular rate.  History of the same.  Symptom  onset just a few hours prior to arrival.  Initially given Cardizem with improvement of rate and has resolution of symptoms.  He remained in A. fib with a heart rate of approximately 100 120 though.  He was cardioverted successfully.  He was advised to take his medications regularly and follow back up with his cardiologist, Dr. Duke Salvia.    Final Clinical Impression(s) / ED Diagnoses Final diagnoses:  Atrial fibrillation with  RVR [facility])    Rx / DC Orders ED Discharge Orders    None       Raeford Razor, MD 09/09/19 2309

## 2019-09-09 NOTE — ED Triage Notes (Signed)
Pt c/o palpitations and CP x 1.5 hours. Pt reports associated ShOB. Denies nausea, diaphoresis.

## 2019-09-09 NOTE — Discharge Instructions (Addendum)
Take your medications regularly. Follow-up with Dr Duke Salvia.

## 2019-09-14 NOTE — Progress Notes (Signed)
Cardiology Clinic Note   Patient Name: Christian Schroeder Date of Encounter: 09/15/2019  Primary Care Provider:  No primary care provider on file. Primary Cardiologist:  Christian Si, MD  Patient Profile    Christian Schroeder 39 year old male presents today for follow-up evaluation of his atrial fibrillation.  Past Medical History    Past Medical History:  Diagnosis Date  . Asthma   . Atrial fibrillation ([facility])   . Essential hypertension 09/26/2017  . Hypertension    Past Surgical History:  Procedure Laterality Date  . CARDIAC SURGERY     cardioversion    Allergies  Allergies  Allergen Reactions  . Latex Itching and Rash    History of Present Illness    Christian Schroeder has a past medical history of paroxysmal atrial fibrillation and asthma.  He underwent TEE/DCCV at age 13.  He also underwent an ablation procedure while he was living in [place].  On 04/2017 he was awakened from sleep with a recurrent episode of atrial fibrillation.  He was seen in the emergency department with a heart rate of 193 bpm.  He described his heart is racing and experienced difficulty breathing.  He underwent DCCV followed up with atrial flutter A. fib clinic 04/2017.  He was started on diltiazem and Eliquis.  His echocardiogram 04/2017 showed an LVEF of 55-60% and mildly dilated left atrium.  He was also referred for sleep study at that time.  He has canceled his sleep study 3 times due to work.  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  He was last seen by Christian Schroeder 07/30/2018.  During that time he had quit smoking.  He had abstained from smoking for nearly 2 months.  He had gained 25 pounds.  He is a single father and cares for his daughter.  He stated he was trying to be healthier for her and plan to do a sugar fast.  He had been exercising 3 times per week lifting weights, doing aerobics activities and walking on his treadmill.  He denies chest pain and shortness of  breath.  His blood pressure had been around 140 systolic at home.  He denied lower extremity edema orthopnea and PND.  On 09/09/2019 he presented to the emergency department with complaints of palpitations and shortness of breath.  He had begun about an hour prior to his arrival.  He stated that he had not been compliant with his medications for couple days.  He denied any pain but felt short of breath at that time his EKG showed a ventricular rate of 163, atrial fibrillation with rapid ventricular response right axis deviation.  He underwent DCCV at that time with synchronous biphasic 120 J x1 back to normal sinus rhythm.  He presents to the clinic today and states he feels like his heart rate is back to normal today.  He had a couple days where he did not take his diltiazem medication, went back into atrial fibrillation, and then presented to the emergency department.  He states that he had his mom die a couple months ago as well as 10 other family members in the last year.  This has created in immense amount of stress in his life.  He states he has also not been as active and not been working.  He plans to start back working and become more physically active after this visit.  He states he has had some indigestion when he eats spicy  foods or beans.  But denies exertional chest pain or discomfort.  He has noticed back pain over the last week.  I will have him visit Christian Schroeder at [facility] for massage/physical therapy/other modalities.  He denies chest pain, shortness of breath, lower extremity edema, fatigue, palpitations, melena, hematuria, hemoptysis, diaphoresis, weakness, presyncope, syncope, orthopnea, and PND.   Home Medications    Prior to Admission medications   Medication Sig Start Date End Date Taking? Authorizing Provider  diltiazem (CARDIZEM CD) 120 MG 24 hr capsule Take 1 capsule (120 mg total) by mouth daily. 07/21/19   Christian Gross, NP  hydrochlorothiazide (HYDRODIURIL)  25 MG tablet Take 1 tablet (25 mg total) by mouth daily. AS NEEDED FOR BLOOD PRESSURE 09/18/18   Christian Si, MD  lisinopril (ZESTRIL) 10 MG tablet TAKE 1 TABLET BY MOUTH ONCE DAILY AS NEEDED FOR BLOOD PRESSURE 01/19/19   Christian Si, MD    Family History    Family History  Problem Relation Age of Onset  . Kidney disease Mother   . Hypertension Mother   . Hypertension Father   . Kidney disease Brother    He indicated that his mother is alive. He indicated that his father is alive. He indicated that all of his three brothers are alive.  Social History    Social History   Socioeconomic History  . Marital status: Single    Spouse name: Not on file  . Number of children: Not on file  . Years of education: Not on file  . Highest education level: Not on file  Occupational History  . Not on file  Tobacco Use  . Smoking status: Former Smoker    Packs/day: 1.00    Types: Cigarettes    Quit date: 05/30/2018    Years since quitting: 1.2  . Smokeless tobacco: Current User  Substance and Sexual Activity  . Alcohol use: Yes    Comment: occ  . Drug use: Not Currently    Types: Marijuana  . Sexual activity: Not on file  Other Topics Concern  . Not on file  Social History Narrative  . Not on file   Social Determinants of Health   Financial Resource Strain:   . Difficulty of Paying Living Expenses: Not on file  Food Insecurity:   . Worried About Programme researcher, broadcasting/film/video in the Last Year: Not on file  . Ran Out of Food in the Last Year: Not on file  Transportation Needs:   . Lack of Transportation (Medical): Not on file  . Lack of Transportation (Non-Medical): Not on file  Physical Activity:   . Days of Exercise per Week: Not on file  . Minutes of Exercise per Session: Not on file  Stress:   . Feeling of Stress : Not on file  Social Connections:   . Frequency of Communication with Friends and Family: Not on file  . Frequency of Social Gatherings with Friends and Family:  Not on file  . Attends Religious Services: Not on file  . Active Member of Clubs or Organizations: Not on file  . Attends Banker Meetings: Not on file  . Marital Status: Not on file  Intimate Partner Violence:   . Fear of Current or Ex-Partner: Not on file  . Emotionally Abused: Not on file  . Physically Abused: Not on file  . Sexually Abused: Not on file     Review of Systems    General:  No chills, fever, night sweats or weight  changes.  Cardiovascular:  No chest pain, dyspnea on exertion, edema, orthopnea, palpitations, paroxysmal nocturnal dyspnea. Dermatological: No rash, lesions/masses Respiratory: No cough, dyspnea Urologic: No hematuria, dysuria Abdominal:   No nausea, vomiting, diarrhea, bright red blood per rectum, melena, or hematemesis Neurologic:  No visual changes, wkns, changes in mental status. All other systems reviewed and are otherwise negative except as noted above.  Physical Exam    VS:  BP (!) 141/90 (BP Location: Left Arm, Patient Position: Sitting, Cuff Size: Large)   Pulse 87   Ht 6\' 5"  (1.956 m)   Wt (!) 302 lb 12.8 oz (137.3 kg)   BMI 35.91 kg/m  , BMI Body mass index is 35.91 kg/m. GEN: Well nourished, well developed, in no acute distress. HEENT: normal. Neck: Supple, no JVD, carotid bruits, or masses. Cardiac: RRR, no murmurs, rubs, or gallops. No clubbing, cyanosis, edema.  Radials/DP/PT 2+ and equal bilaterally.  Respiratory:  Respirations regular and unlabored, clear to auscultation bilaterally. GI: Soft, nontender, nondistended, BS + x 4. MS: no deformity or atrophy. Skin: warm and dry, no rash. Neuro:  Strength and sensation are intact. Psych: Normal affect.  Accessory Clinical Findings    ECG personally reviewed by me today-sinus rhythm with sinus arrhythmia rightward axis deviation 79 bpm- No acute changes  EKG 09/10/2019 Atrial fibrillation with rapid ventricular response rightward axis deviation 163  bpm  Echocardiogram 05/09/2017 Study Conclusions  - Left ventricle: GLSS is normal at -20% The cavity size was   normal. There was moderate concentric hypertrophy. Systolic   function was normal. The estimated ejection fraction was in the   range of 55% to 60%. Wall motion was normal; there were no   regional wall motion abnormalities. Left ventricular diastolic   function parameters were normal. - Left atrium: The atrium was mildly dilated.   Assessment & Plan   1.  Atrial fibrillation-presented to the emergency department on 09/09/2019 with a heart rate of 163 and atrial fibrillation with rapid ventricular response.  He was also short of breath.  Underwent DCCV 120 J x1 converting him back to normal sinus rhythm.  Stressed compliance with prescribed medications. Continue diltiazem 120 mg daily Avoid triggers caffeine, chocolate, EtOH etc. Increase physical activity as tolerated Discussed healthy alternatives to stress reduction.  Mindfulness stress reduction sheet mailed  Essential hypertension-BP today 141/90 Continue HCTZ 25 mg as needed for blood pressure Continue lisinopril 10 mg daily Heart healthy low-sodium diet salty 6 mailed. Increase physical activity as tolerated Discussed weight loss Will hold increasing medication at this time due to patient increasing physical activity and changing diet.   Disposition: Follow-up with Dr. [name] in 3 months.  Jossie Ng. [facility] [place] [address] Office [phone] Fax [phone]

## 2019-09-15 ENCOUNTER — Other Ambulatory Visit: Payer: Self-pay

## 2019-09-15 ENCOUNTER — Encounter: Payer: Self-pay | Admitting: General Practice

## 2019-09-15 ENCOUNTER — Ambulatory Visit (INDEPENDENT_AMBULATORY_CARE_PROVIDER_SITE_OTHER): Payer: Medicaid Other | Admitting: General Practice

## 2019-09-15 VITALS — BP 141/90 | HR 87 | Ht 77.0 in | Wt 302.8 lb

## 2019-09-15 DIAGNOSIS — I480 Paroxysmal atrial fibrillation: Secondary | ICD-10-CM | POA: Diagnosis not present

## 2019-09-15 DIAGNOSIS — I10 Essential (primary) hypertension: Secondary | ICD-10-CM | POA: Diagnosis not present

## 2019-09-15 NOTE — Patient Instructions (Signed)
Special Instructions: OK TO SEE Vassie Moment [facility] Address: [address], [place], [place] [address] Phone: [phone]  Reduce your risk of getting COVID-19 With your heart disease it is especially important for people at increased risk of severe illness from COVID-19, and those who live with them, to protect themselves from getting COVID-19. The best way to protect yourself and to help reduce the spread of the virus that causes COVID-19 is to: Marland Kitchen Limit your interactions with other people as much as possible. . Take precautions to prevent getting COVID-19 when you do interact with others. If you start feeling sick and think you may have COVID-19, get in touch with your healthcare provider within 24 hours.  Follow-Up: 3 months  In Person Chilton Si, MD.    At [facility], you and your health needs are our priority.  As part of our continuing mission to provide you with exceptional heart care, we have created designated Provider Care Teams.  These Care Teams include your primary Cardiologist (physician) and Advanced Practice Providers (APPs -  Physician Assistants and Nurse Practitioners) who all work together to provide you with the care you need, when you need it.  Thank you for choosing [facility] at [facility]!!

## 2019-09-29 MED ORDER — ACETAMINOPHEN 325 MG PO TABS
650.00 | ORAL_TABLET | ORAL | Status: DC
Start: ? — End: 2019-09-29

## 2019-09-29 MED ORDER — ONDANSETRON HCL 4 MG/2ML IJ SOLN
4.00 | INTRAMUSCULAR | Status: DC
Start: ? — End: 2019-09-29

## 2019-09-29 MED ORDER — FLECAINIDE ACETATE 100 MG PO TABS
50.00 | ORAL_TABLET | ORAL | Status: DC
Start: 2019-09-29 — End: 2019-09-29

## 2019-09-29 MED ORDER — APIXABAN 5 MG PO TABS
5.00 | ORAL_TABLET | ORAL | Status: DC
Start: 2019-09-29 — End: 2019-09-29

## 2019-09-29 MED ORDER — HYDRALAZINE HCL 20 MG/ML IJ SOLN
10.00 | INTRAMUSCULAR | Status: DC
Start: ? — End: 2019-09-29

## 2019-09-29 MED ORDER — NITROGLYCERIN 0.4 MG SL SUBL
0.40 | SUBLINGUAL_TABLET | SUBLINGUAL | Status: DC
Start: ? — End: 2019-09-29

## 2019-09-29 MED ORDER — DILTIAZEM HCL ER BEADS 240 MG PO CP24
240.00 | ORAL_CAPSULE | ORAL | Status: DC
Start: 2019-09-29 — End: 2019-09-29

## 2019-10-06 ENCOUNTER — Telehealth: Payer: Self-pay

## 2019-10-06 NOTE — Telephone Encounter (Signed)
Left detailed message re: appt tomorrow and the office being closed and that we are changing to a virtual visit.  Appt changed to VV

## 2019-10-06 NOTE — Progress Notes (Signed)
Virtual Visit via Telephone Note   This visit type was conducted due to national recommendations for restrictions regarding the COVID-19 Pandemic (e.g. social distancing) in an effort to limit this patient's exposure and mitigate transmission in our community.  Due to his co-morbid illnesses, this patient is at least at moderate risk for complications without adequate follow up.  This format is felt to be most appropriate for this patient at this time.  The patient did not have access to video technology/had technical difficulties with video requiring transitioning to audio format only (telephone).  All issues noted in this document were discussed and addressed.  No physical exam could be performed with this format.  Please refer to the patient's chart for his  consent to telehealth for [facility].  Evaluation Performed:  Follow-up visit  This visit type was conducted due to national recommendations for restrictions regarding the COVID-19 Pandemic (e.g. social distancing).  This format is felt to be most appropriate for this patient at this time.  All issues noted in this document were discussed and addressed.  No physical exam was performed (except for noted visual exam findings with Video Visits).  Please refer to the patient's chart (MyChart message for video visits and phone note for telephone visits) for the patient's consent to telehealth for [facility]  Date:  10/06/2019   ID:  Christian Schroeder, DOB 23-Oct-1980, MRN [id number]  Patient Location:  [place] [address]   Provider location:      [place] [address] Office [phone] Fax [phone]   PCP:  No primary care provider on file.  Cardiologist:  Skeet Latch, MD  Electrophysiologist:  None   Chief Complaint: Follow-up  History of Present Illness:    Christian Schroeder is a 39 y.o. male who presents via audio/video conferencing for a telehealth visit  today.  Patient verified DOB and address.  The patient does not symptoms concerning for COVID-19 infection (fever, chills, cough, or new SHORTNESS OF BREATH).   Christian Schroeder has a past medical history of paroxysmal atrial fibrillation and asthma.  He underwent TEE/DCCV at age 32.  He also underwent an ablation procedure while he was living in [place].  On 04/2017 he was awakened from sleep with a recurrent episode of atrial fibrillation.  He was seen in the emergency department with a heart rate of 193 bpm.  He described his heart is racing and experienced difficulty breathing.  He underwent DCCV followed up with atrial flutter A. fib clinic 04/2017.  He was started on diltiazem and Eliquis.  His echocardiogram 04/2017 showed an LVEF of 55-60% and mildly dilated left atrium.  He was also referred for sleep study at that time.  He has canceled his sleep study 3 times due to work.  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  He was last seen by Dr. [name] 07/30/2018.  During that time he had quit smoking.  He had abstained from smoking for nearly 2 months.  He had gained 25 pounds.  He is a single father and cares for his daughter.  He stated he was trying to be healthier for her and plan to do a sugar fast.  He had been exercising 3 times per week lifting weights, doing aerobics activities and walking on his treadmill.  He denies chest pain and shortness of breath.  His blood pressure had been around 937 systolic  at home.  He denied lower extremity edema orthopnea and PND.  On 09/09/2019 he presented to the emergency department with complaints of palpitations and shortness of breath.  He had begun about an hour prior to his arrival.  He stated that he had not been compliant with his medications for couple days.  He denied any pain but felt short of breath at that time his EKG showed a ventricular rate of 163, atrial fibrillation with rapid ventricular response right axis  deviation.  He underwent DCCV at that time with synchronous biphasic 120 J x1 back to normal sinus rhythm.  He presented to the clinic 09/15/19 and stated he felt like his heart rate was back to normal.  He had a couple days where he did not take his diltiazem medication, went back into atrial fibrillation, and then presented to the emergency department.  He stated that he had his mom die a couple months prior as well as 10 other family members in the last year.  This had created an immense amount of stress in his life.  He stated he had also not been as active and had not been working.  He planed to start back working and become more physically active after the visit.  He stated he  had some indigestion when he would eat spicy foods or beans.  But denied exertional chest pain or discomfort.  He had noticed back pain over the last week.  Dr.Damien Rodulfo at [facility] for massage/physical therapy/other modalities was discussed.  On 09/27/2019 he presented to the emergency department of [facility] in [place].  He had been visiting his father's house and was helping with some yard work.  He noticed palpitations that lasted for a couple of hours.  He took 240 mg of Cardizem and waited for about an hour however, his palpitations remain.  He was found to be in atrial fibrillation with a heart rate of 128 bpm blood pressure 144/94.  He was given 10 mg of IV Cardizem x2 and 0.25 mg of digoxin.  At that time his heart rate was still 120 bpm.  At that time his BMP showed a potassium of 3.3, troponins 0.02, CBC unremarkable.  At that time he stated he needed to leave AMA from the hospital to care for his 80-year-old daughter.  Risks were discussed by the attending provider.  He was admitted to the [facility] on 09/28/2019-09/29/2019.  Blood pressure was 122/87 his heart rate was 147.  CXR was unremarkable.  Potassium was 3.1.  He received 10 mg bolus IV Cardizem and 5 mg IV metoprolol.   He then received another dose of Cardizem CD 120 mg once followed by Cardizem gtt.  He underwent TEE that showed ejection fraction of 50-55% mild concentric LVH, mildly dilated left atrium, trace mitral regurgitation, and trace tricuspid regurgitation.  He then underwent a successful DCCV.  He was started on Eliquis 5 mg twice daily, flecainide 50 mg twice daily and is Cardizem was increased to 240 mg daily.  He is seen virtually today and states he feels well.  He feels that his heart rate is much better controlled on his new medication regimen.  He has not felt any further episodes of palpitations or irregular heartbeats.  He states he has had a cough since his DCCV on 09/29/2019.  This is causing him some mild chest discomfort which he notices when he takes a deep breath.  No chest discomfort with activity.  He  continues to be sedentary and states that he is not motivated to do physical activity at this time.  He denies medication side effects and states he is compliant with his medication regimen including his Eliquis.  I will have him follow-up in 1 month for a follow-up EKG, BMP, and to talk about anticoagulation at that time.  He denies  shortness of breath, lower extremity edema, fatigue, palpitations, melena, hematuria, hemoptysis, diaphoresis, weakness,  syncope, orthopnea, and PND.   Prior CV studies:   The following studies were reviewed today:  EKG 09/15/2019 sinus rhythm with sinus arrhythmia rightward axis deviation 79 bpm- No acute changes  EKG 09/10/2019 Atrial fibrillation with rapid ventricular response rightward axis deviation 163 bpm  Echocardiogram 05/09/2017 Study Conclusions  - Left ventricle: GLSS is normal at -20% The cavity size was normal. There was moderate concentric hypertrophy. Systolic function was normal. The estimated ejection fraction was in the range of 55% to 60%. Wall motion was normal; there were no regional wall motion abnormalities. Left  ventricular diastolic function parameters were normal. - Left atrium: The atrium was mildly dilated.  Past Medical History:  Diagnosis Date  . Asthma   . Atrial fibrillation (HCC)   . Essential hypertension 09/26/2017  . Hypertension    Past Surgical History:  Procedure Laterality Date  . CARDIAC SURGERY     cardioversion     No outpatient medications have been marked as taking for the 10/07/19 encounter (Appointment) with Ronney Asters, NP.     Allergies:   Latex   Social History   Tobacco Use  . Smoking status: Former Smoker    Packs/day: 1.00    Types: Cigarettes    Quit date: 05/30/2018    Years since quitting: 1.3  . Smokeless tobacco: Current User  Substance Use Topics  . Alcohol use: Yes    Comment: occ  . Drug use: Not Currently    Types: Marijuana     Family Hx: The patient's family history includes Hypertension in his father and mother; Kidney disease in his brother and mother.  ROS:   Please see the history of present illness.     All other systems reviewed and are negative.   Labs/Other Tests and Data Reviewed:    Recent Labs: 09/09/2019: BUN 16; Creatinine, Ser 1.02; Hemoglobin 15.1; Magnesium 2.2; Platelets 346; Potassium 3.5; Sodium 138   Recent Lipid Panel No results found for: CHOL, TRIG, HDL, CHOLHDL, LDLCALC, LDLDIRECT  Wt Readings from Last 3 Encounters:  09/15/19 (!) 302 lb 12.8 oz (137.3 kg)  09/09/19 290 lb (131.5 kg)  07/30/18 299 lb (135.6 kg)     Exam:    Vital Signs:  There were no vitals taken for this visit.   Well nourished, well developed male in no  acute distress.   ASSESSMENT & PLAN:    1.  Atrial fibrillation-presented to the emergency department   [facility] on 09/28/2019-09/29/2019.  Blood pressure was 122/87 his heart rate was 147.  He received 10 mg bolus IV Cardizem and 5 mg IV metoprolol.  He then received another dose of Cardizem CD 120 mg once followed by Cardizem gtt.  He underwent TEE and  then underwent a successful DCCV.  He was started on Eliquis 5 mg twice daily, flecainide 50 mg twice daily and is Cardizem was increased to 240 mg daily.    This patients CHA2DS2-VASc Score and unadjusted Ischemic Stroke Rate (% per year) is equal to 0.6 % stroke rate/year from  a score of 1(HTN) Continue diltiazem 240 mg daily Continue flecainide 50 mg twice daily Continue apixaban 5 mg twice daily Avoid triggers caffeine, chocolate, EtOH etc. Increase physical activity as tolerated Instructed to present to the emergency department with increased chest pain, increased shortness of breath, and fast irregular heartbeat.  Expressed understanding. Order BMP Order EKG  Essential hypertension-BP today unable to take at home. Continue diltiazem 240 mg daily Continue lisinopril 10 mg daily Heart healthy low-sodium diet salty 6 mailed. Increase physical activity as tolerated Discussed weight loss Will hold increasing medication at this time due to patient increasing physical activity and changing diet.   Disposition: Follow-up with Dr. Duke Salvia in 1 month.  COVID-19 Education: The signs and symptoms of COVID-19 were discussed with the patient and how to seek care for testing (follow up with PCP or arrange E-visit).  The importance of social distancing was discussed today.  Patient Risk:   After full review of this patients clinical status, I feel that they are at least moderate risk at this time.  Time:   Today, I have spent 15 minutes with the patient with telehealth technology discussing medication, diet, exercise, atrial fibrillation.     Medication Adjustments/Labs and Tests Ordered: Current medicines are reviewed at length with the patient today.  Concerns regarding medicines are outlined above.   Tests Ordered: No orders of the defined types were placed in this encounter.  Medication Changes: No orders of the defined types were placed in this encounter.   Disposition:  in 1  month(s)  Signed,  Thomasene Ripple. Sahian Kerney NP-C     [facility] [address] Office [phone] Fax [phone]

## 2019-10-07 ENCOUNTER — Telehealth (INDEPENDENT_AMBULATORY_CARE_PROVIDER_SITE_OTHER): Payer: Medicaid Other | Admitting: General Practice

## 2019-10-07 DIAGNOSIS — I10 Essential (primary) hypertension: Secondary | ICD-10-CM

## 2019-10-07 DIAGNOSIS — I480 Paroxysmal atrial fibrillation: Secondary | ICD-10-CM | POA: Diagnosis not present

## 2019-10-07 DIAGNOSIS — Z79899 Other long term (current) drug therapy: Secondary | ICD-10-CM

## 2019-10-07 MED ORDER — FLECAINIDE ACETATE 50 MG PO TABS: 50.0000 mg | ORAL_TABLET | Freq: Two times a day (BID) | ORAL | 3 refills | Status: DC

## 2019-10-07 MED ORDER — APIXABAN 5 MG PO TABS: 5.0000 mg | ORAL_TABLET | Freq: Two times a day (BID) | ORAL | Status: DC

## 2019-10-07 MED ORDER — DILTIAZEM HCL ER COATED BEADS 240 MG PO CP24: 120.0000 mg | ORAL_CAPSULE | Freq: Every day | ORAL | Status: DC

## 2019-10-07 NOTE — Patient Instructions (Addendum)
Medication Instructions:  Continue diltiazem 240 mg daily  Continue flecainide 50 mg twice daily  Continue apixaban 5 mg twice daily If you need a refill on your cardiac medications before your next appointment, please call your pharmacy.  Labwork: BMET AT LEAST 3 DAYS BEFORE YOUR FOLLOW UP APPOINTMENT HERE IN OUR OFFICE AT [facility]     You will NOT need to fast   If you have labs (blood work) drawn today and your tests are completely normal, you will receive your results only by: Marland Kitchen MyChart Message (if you have MyChart) OR A paper copy in the mail If you have any lab test that is abnormal or we need to change your treatment, we will call you to review these results. You may go to any [facility] lab that is convenient for you however, we do have a lab in our office that is able to assist you. You do NOT need an appointment for our lab. Once in our office in our office lobby there is a podium where you sign-in and ring the doorbell to alert [place] that you are here. Lab is open 8:00am and closes at 4:00pm; closes for lunch from 12:45 - 1:45pm. PLEASE BRING A COPY OF YOUR INSURANCE CARD WITH YOU.  Special Instructions: TAKE AND LOG YOUR BLOOD PRESSURE DAILY AND TAKE WITH YOU TO YOUR APPOINTMENTS.  PLEASE READ AND FOLLOW SALTY 6 ATTACHED  Reduce your risk of getting COVID-19 With your heart disease it is especially important for people at increased risk of severe illness from COVID-19, and those who live with them, to protect themselves from getting COVID-19. The best way to protect yourself and to help reduce the spread of the virus that causes COVID-19 is to: Marland Kitchen Limit your interactions with other people as much as possible. . Take precautions to prevent getting COVID-19 when you do interact with others. If you start feeling sick and think you may have COVID-19, get in touch with your healthcare provider within 24 hours.  Follow-Up: follow-up in 1 month with Dr. Duke Salvia  11/09/2019 10:40 AM WITH   Chilton Si,  MD  At [facility], you and your health needs are our priority.  As part of our continuing mission to provide you with exceptional heart care, we have created designated Provider Care Teams.  These Care Teams include your primary Cardiologist (physician) and Advanced Practice Providers (APPs -  Physician Assistants and Nurse Practitioners) who all work together to provide you with the care you need, when you need it.  Thank you for choosing [facility] at [facility]!!

## 2019-11-05 ENCOUNTER — Other Ambulatory Visit: Payer: Self-pay | Admitting: Cardiovascular Disease

## 2019-11-05 NOTE — Telephone Encounter (Signed)
*  STAT* If patient is at the pharmacy, call can be transferred to refill team.   1. Which medications need to be refilled? (please list name of each medication and dose if known) CARDIZIM 250mg   2. Which pharmacy/location (including street and city if local pharmacy) is medication to be sent to?  [facility] [address]  3. Do they need a 30 day or 90 day supply? 90

## 2019-11-05 NOTE — Telephone Encounter (Signed)
*  STAT* If patient is at the pharmacy, call can be transferred to refill team.   1. Which medications need to be refilled? (please list name of each medication and dose if known) CARDIZIM 250 mg  2. Which pharmacy/location (including street and city if local pharmacy) is medication to be sent to?  3. Do they need a 30 day or 90 day supply? 90

## 2019-11-09 ENCOUNTER — Ambulatory Visit: Payer: Medicaid Other | Admitting: Cardiovascular Disease

## 2019-11-09 ENCOUNTER — Other Ambulatory Visit: Payer: Self-pay | Admitting: Adult Health

## 2019-11-23 ENCOUNTER — Encounter: Payer: Self-pay | Admitting: Cardiovascular Disease

## 2019-12-09 ENCOUNTER — Ambulatory Visit: Payer: Medicaid Other | Admitting: Cardiovascular Disease

## 2020-01-11 ENCOUNTER — Other Ambulatory Visit: Payer: Self-pay

## 2020-01-11 ENCOUNTER — Encounter (HOSPITAL_BASED_OUTPATIENT_CLINIC_OR_DEPARTMENT_OTHER): Payer: Self-pay | Admitting: Emergency Medicine

## 2020-01-11 ENCOUNTER — Emergency Department (HOSPITAL_BASED_OUTPATIENT_CLINIC_OR_DEPARTMENT_OTHER): Payer: Medicaid Other

## 2020-01-11 ENCOUNTER — Emergency Department (HOSPITAL_BASED_OUTPATIENT_CLINIC_OR_DEPARTMENT_OTHER)
Admission: EM | Admit: 2020-01-11 | Discharge: 2020-01-11 | Disposition: A | Discharge status: Home / Self Care | Payer: Medicaid Other | Attending: Emergency Medicine | Admitting: Emergency Medicine

## 2020-01-11 DIAGNOSIS — Z91040 Latex allergy status: Secondary | ICD-10-CM | POA: Diagnosis not present

## 2020-01-11 DIAGNOSIS — I10 Essential (primary) hypertension: Secondary | ICD-10-CM | POA: Diagnosis not present

## 2020-01-11 DIAGNOSIS — Y929 Unspecified place or not applicable: Secondary | ICD-10-CM | POA: Insufficient documentation

## 2020-01-11 DIAGNOSIS — S62637A Displaced fracture of distal phalanx of left little finger, initial encounter for closed fracture: Secondary | ICD-10-CM | POA: Insufficient documentation

## 2020-01-11 DIAGNOSIS — Z79899 Other long term (current) drug therapy: Secondary | ICD-10-CM | POA: Insufficient documentation

## 2020-01-11 DIAGNOSIS — I4891 Unspecified atrial fibrillation: Secondary | ICD-10-CM | POA: Diagnosis not present

## 2020-01-11 DIAGNOSIS — Y999 Unspecified external cause status: Secondary | ICD-10-CM | POA: Diagnosis not present

## 2020-01-11 DIAGNOSIS — J45909 Unspecified asthma, uncomplicated: Secondary | ICD-10-CM | POA: Diagnosis not present

## 2020-01-11 DIAGNOSIS — Z7901 Long term (current) use of anticoagulants: Secondary | ICD-10-CM | POA: Diagnosis not present

## 2020-01-11 DIAGNOSIS — W228XXA Striking against or struck by other objects, initial encounter: Secondary | ICD-10-CM | POA: Insufficient documentation

## 2020-01-11 DIAGNOSIS — Z87891 Personal history of nicotine dependence: Secondary | ICD-10-CM | POA: Diagnosis not present

## 2020-01-11 DIAGNOSIS — S62639A Displaced fracture of distal phalanx of unspecified finger, initial encounter for closed fracture: Secondary | ICD-10-CM

## 2020-01-11 DIAGNOSIS — Y93B3 Activity, free weights: Secondary | ICD-10-CM | POA: Diagnosis not present

## 2020-01-11 DIAGNOSIS — S6992XA Unspecified injury of left wrist, hand and finger(s), initial encounter: Secondary | ICD-10-CM | POA: Diagnosis present

## 2020-01-11 NOTE — ED Provider Notes (Signed)
[facility] EMERGENCY DEPARTMENT Provider Note   CSN: [id number] Arrival date & time: 01/11/20  1458     History Chief Complaint  Patient presents with  . Finger Injury    Christian Schroeder is a 39 y.o. male with PMH of atrial fibrillation on Eliquis who presents to the ED after sustaining mechanical injury last night.  Patient reports that he was performing chest press with 70 pound dumbbells when he struck his own hand with a dumbbell.  He immediately felt significant pain involving pinky finger left hand that radiated towards his the rest of his fingers.  He has since noticed significant moderate involving distal tip.  He states that it is worse when he lets his hand hanging down low.  He has not taken anything for his pain symptoms.  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  He denies any open wounds, nailbed involvement, numbness or weakness, or other symptoms.  HPI     Past Medical History:  Diagnosis Date  . Asthma   . Atrial fibrillation (HCC)   . Essential hypertension 09/26/2017  . Hypertension     Patient Active Problem List   Diagnosis Date Noted  . Essential hypertension 09/26/2017  . Paroxysmal atrial fibrillation (HCC) 09/24/2017    Past Surgical History:  Procedure Laterality Date  . CARDIAC SURGERY     cardioversion       Family History  Problem Relation Age of Onset  . Kidney disease Mother   . Hypertension Mother   . Hypertension Father   . Kidney disease Brother     Social History   Tobacco Use  . Smoking status: Former Smoker    Packs/day: 1.00    Types: Cigarettes    Quit date: 05/30/2018    Years since quitting: 1.6  . Smokeless tobacco: Current User  Substance Use Topics  . Alcohol use: Yes    Comment: occ  . Drug use: Not Currently    Types: Marijuana    Home Medications Prior to Admission medications   Medication Sig Start Date End Date Taking? Authorizing  Provider  apixaban (ELIQUIS) 5 MG TABS tablet Take 1 tablet (5 mg total) by mouth 2 (two) times daily. 10/07/19   Ronney Asters, NP  CARTIA XT 120 MG 24 hr capsule Take 1 capsule by mouth once daily 11/09/19   Ronney Asters, NP  diltiazem (CARDIZEM CD) 240 MG 24 hr capsule Take 1 capsule (240 mg total) by mouth daily. 10/07/19   Ronney Asters, NP  flecainide (TAMBOCOR) 50 MG tablet Take 1 tablet (50 mg total) by mouth 2 (two) times daily. 10/07/19   Ronney Asters, NP  hydrochlorothiazide (HYDRODIURIL) 25 MG tablet Take 1 tablet (25 mg total) by mouth daily. AS NEEDED FOR BLOOD PRESSURE Patient taking differently: Take 50 mg by mouth daily. AS NEEDED FOR BLOOD PRESSURE  09/18/18   Chilton Si, MD  lisinopril (ZESTRIL) 10 MG tablet TAKE 1 TABLET BY MOUTH ONCE DAILY AS NEEDED FOR BLOOD PRESSURE 01/19/19   Chilton Si, MD    Allergies    Latex  Review of Systems   Review of Systems  Musculoskeletal: Positive for arthralgias.  Skin: Positive for color change. Negative for wound.  Neurological: Negative for weakness and numbness.    Physical Exam Updated Vital Signs BP (!) 159/110 (BP Location: Right Arm)   Pulse 73   Resp 18   Ht  6\' 5"  (1.956 m)   Wt 129.3 kg   SpO2 99%   BMI 33.80 kg/m   Physical Exam Vitals and nursing note reviewed. Exam conducted with a chaperone present.  Constitutional:      Appearance: Normal appearance.  HENT:     Head: Normocephalic and atraumatic.  Eyes:     General: No scleral icterus.    Conjunctiva/sclera: Conjunctivae normal.  Cardiovascular:     Rate and Rhythm: Normal rate and regular rhythm.     Pulses: Normal pulses.     Heart sounds: Normal heart sounds.  Pulmonary:     Effort: Pulmonary effort is normal.     Breath sounds: Normal breath sounds.  Musculoskeletal:     Comments: Left fifth finger: Moderate swelling and mild ecchymoses involving distal tip, distal to DIP.  ROM intact, albeit mildly limited due to  inflammation and pain.  Cannot make a closed fist.  Sensation intact.  No significant bruising or bleeding below nail plate.  No significant inflammation.  Compartments soft.   Skin:    General: Skin is dry.  Neurological:     Mental Status: He is alert.     GCS: GCS eye subscore is 4. GCS verbal subscore is 5. GCS motor subscore is 6.  Psychiatric:        Mood and Affect: Mood normal.        Behavior: Behavior normal.        Thought Content: Thought content normal.     ED Results / Procedures / Treatments   Labs (all labs ordered are listed, but only abnormal results are displayed) Labs Reviewed - No data to display  EKG None  Radiology DG Finger Little Left  Result Date: 01/11/2020 CLINICAL DATA:  Left finger crush injury EXAM: LEFT LITTLE FINGER 2+V COMPARISON:  None. FINDINGS: There is a nondisplaced vertically oriented fracture of the distal tuft. No other definite fractures are seen. The articular surfaces appear to be maintained. Overlying nail bed appears to be intact. Diffuse soft tissue swelling is seen. IMPRESSION: Nondisplaced fracture of the distal tuft with surrounding soft tissue swelling. Electronically Signed   By: 01/13/2020 M.D.   On: 01/11/2020 15:39    Procedures Procedures (including critical care time)  Medications Ordered in ED Medications - No data to display  ED Course  I have reviewed the triage vital signs and the nursing notes.  Pertinent labs & imaging results that were available during my care of the patient were reviewed by me and considered in my medical decision making (see chart for details).    MDM Rules/Calculators/A&P                      Patient presents to the ED with points of crush injury.  Plain films obtained of hand demonstrate nondisplaced fracture involving distal tuft.  There is surrounding soft tissue swelling that is obvious on physical exam.  However compartments remain soft and he is able to demonstrate ROM intact, albeit  limited due to inflammation.  Sensation intact throughout.  No significant swelling or pain that would otherwise require trephination.  I did offer it given his discomfort, however patient declined.  Will provide splint to keep DIP joint in extension.   Encouraging patient to keep his hand elevated to help mitigate swelling and discomfort.  Encouraging Tylenol and ibuprofen as needed for symptoms of pain.  I asked that he follow-up with hand surgery tomorrow, Dr. 01/13/2020, given his tuft fracture.  He works with his hands so I would appreciate ongoing evaluation and management to ensure that there is no resulting long-term dysfunction.   Strict ED return precautions discussed.  All of the evaluation and work-up results were discussed with the patient and any family at bedside. They were provided opportunity to ask any additional questions and have none at this time. They have expressed understanding of verbal discharge instructions as well as return precautions and are agreeable to the plan.   Final Clinical Impression(s) / ED Diagnoses Final diagnoses:  Closed fracture of tuft of distal phalanx of finger    Rx / DC Orders ED Discharge Orders    None       Corena Herter, PA-C 01/11/20 1700    Tegeler, Gwenyth Allegra, MD 01/12/20 0030

## 2020-01-11 NOTE — ED Triage Notes (Signed)
He smashed his L pinky finger between 2 dumbbells. C/o pain and swelling.

## 2020-01-11 NOTE — ED Notes (Signed)
Pt discharged to home. Discharge instructions have been discussed with patient and/or family members. Pt verbally acknowledges understanding d/c instructions, and endorses comprehension to checkout at registration before leaving.  °

## 2020-01-11 NOTE — Discharge Instructions (Signed)
You have a nondisplaced tuft fracture involving fifth finger.  Please keep the splint on with distal interphalangeal joint in extension.   Please take Tylenol or ibuprofen as needed for symptoms of discomfort.  I highly encourage you to follow-up with Dr. Izora Ribas, hand surgery, for ongoing evaluation and management.  Return to the ED or seek immediate medical attention should you experience any new or worsening symptoms.  If your pain or swelling significantly worsens, you will likely require trephination, per our discussion today.

## 2020-02-16 ENCOUNTER — Other Ambulatory Visit: Payer: Self-pay | Admitting: Cardiovascular Disease

## 2020-03-21 ENCOUNTER — Other Ambulatory Visit: Payer: Self-pay | Admitting: General Practice

## 2020-03-22 MED ORDER — DILTIAZEM HCL ER COATED BEADS 240 MG PO CP24: 240.0000 mg | ORAL_CAPSULE | Freq: Every day | ORAL | 3 refills | Status: DC

## 2020-05-08 ENCOUNTER — Other Ambulatory Visit: Payer: Self-pay

## 2020-05-08 ENCOUNTER — Ambulatory Visit: Payer: Medicaid Other | Attending: Physician Assistant | Admitting: Physical Therapy

## 2020-05-08 ENCOUNTER — Encounter: Payer: Self-pay | Admitting: Physical Therapy

## 2020-05-08 DIAGNOSIS — M545 Low back pain, unspecified: Secondary | ICD-10-CM

## 2020-05-08 DIAGNOSIS — M25511 Pain in right shoulder: Secondary | ICD-10-CM | POA: Diagnosis present

## 2020-05-08 DIAGNOSIS — R252 Cramp and spasm: Secondary | ICD-10-CM | POA: Diagnosis present

## 2020-05-08 DIAGNOSIS — R262 Difficulty in walking, not elsewhere classified: Secondary | ICD-10-CM | POA: Insufficient documentation

## 2020-05-08 NOTE — Patient Instructions (Signed)
Access Code: [email or web address] URL: [email or web address] Date: 05/08/2020 Prepared by: Lysle Rubens  Exercises Supine Lower Trunk Rotation - 1 x daily - 7 x weekly - 3 sets - 10 reps Supine Single Knee to Chest Stretch - 1 x daily - 7 x weekly - 3 sets - 10 reps Seated Flexion Stretch with Swiss Ball - 1 x daily - 7 x weekly - 3 sets - 10 reps Shoulder Flexion Wall Slide with Towel - 1 x daily - 7 x weekly - 3 sets - 10 reps Standing Shoulder Abduction Finger Walk at Wall - 1 x daily - 7 x weekly - 3 sets - 10 reps
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
## 2020-05-15 ENCOUNTER — Ambulatory Visit: Payer: Medicaid Other

## 2020-05-17 ENCOUNTER — Ambulatory Visit: Payer: Medicaid Other | Admitting: Physical Therapy

## 2020-05-18 ENCOUNTER — Ambulatory Visit: Payer: Medicaid Other | Admitting: Physical Therapy

## 2020-05-19 ENCOUNTER — Encounter: Payer: Medicaid Other | Admitting: Physical Therapy

## 2020-05-22 ENCOUNTER — Encounter: Payer: Medicaid Other | Admitting: Physical Therapy

## 2020-05-22 ENCOUNTER — Ambulatory Visit: Payer: Medicaid Other | Attending: Physician Assistant | Admitting: Physical Therapy

## 2020-05-22 DIAGNOSIS — R262 Difficulty in walking, not elsewhere classified: Secondary | ICD-10-CM | POA: Insufficient documentation

## 2020-05-22 DIAGNOSIS — R252 Cramp and spasm: Secondary | ICD-10-CM | POA: Insufficient documentation

## 2020-05-22 DIAGNOSIS — M25511 Pain in right shoulder: Secondary | ICD-10-CM | POA: Insufficient documentation

## 2020-05-22 DIAGNOSIS — M545 Low back pain, unspecified: Secondary | ICD-10-CM | POA: Insufficient documentation

## 2020-05-24 ENCOUNTER — Encounter: Payer: Medicaid Other | Admitting: Physical Therapy

## 2020-05-25 ENCOUNTER — Ambulatory Visit: Payer: Medicaid Other | Admitting: Physical Therapy

## 2020-05-25 ENCOUNTER — Other Ambulatory Visit: Payer: Self-pay

## 2020-05-25 DIAGNOSIS — R262 Difficulty in walking, not elsewhere classified: Secondary | ICD-10-CM | POA: Diagnosis present

## 2020-05-25 DIAGNOSIS — M545 Low back pain, unspecified: Secondary | ICD-10-CM

## 2020-05-25 DIAGNOSIS — R252 Cramp and spasm: Secondary | ICD-10-CM | POA: Diagnosis present

## 2020-05-25 DIAGNOSIS — M25511 Pain in right shoulder: Secondary | ICD-10-CM | POA: Diagnosis present

## 2020-05-25 NOTE — Therapy (Signed)
[place]. [place], [place], [address] Phone: [phone]   Fax:  [phone]  Physical Therapy Treatment  Patient Details  Name: Christian Schroeder MRN: [id number] Date of Birth: 22-Nov-1980 Referring Provider (PT): Staci Righter Date: 05/25/2020   PT End of Session - 05/25/20 1655    Visit Number 2    Date for PT Re-Evaluation 07/08/20    PT Start Time 4967    PT Stop Time 5916    PT Time Calculation (min) 60 min           Past Medical History:  Diagnosis Date  . Asthma   . Atrial fibrillation ([place])   . Essential hypertension 09/26/2017  . Hypertension     Past Surgical History:  Procedure Laterality Date  . CARDIAC SURGERY     cardioversion    There were no vitals filed for this visit.   Subjective Assessment - 05/25/20 1612    Subjective pt has not been to PT since eval " about the same" denies doing HEP- " dont even remeber getting ex" . when asked about pain " I am definately going through some pain" no rating. Multi Q+A but pt very vague                             [facility] Adult PT Treatment/Exercise - 05/25/20 0001      Lumbar Exercises: Aerobic   UBE (Upper Arm Bike) L 3 2 min fwd   minimal effort esp with RT UE-stopped d/t pain   Nustep L 5 5 min    checked phone 2 x     Lumbar Exercises: Seated   Other Seated Lumbar Exercises pelvic ROM on dyna disc 10 x 4 way      Lumbar Exercises: Supine   Other Supine Lumbar Exercises bridge with ball, KTC and obl 10 x      Shoulder Exercises: Seated   Extension Strengthening;Both;10 reps;Theraband    Theraband Level (Shoulder Extension) Level 1 (Yellow)    Row Strengthening;Both;10 reps;Theraband    Theraband Level (Shoulder Row) Level 1 (Yellow)    External Rotation Strengthening;Both;10 reps;Theraband    Theraband Level (Shoulder External Rotation) Level 1 (Yellow)    Other Seated Exercises 1# chest press with bar 10 x-  decreased ROM but pt verb he can do push ups      Modalities   Modalities Electrical Stimulation;Moist Heat      Moist Heat Therapy   Number Minutes Moist Heat 15 Minutes    Moist Heat Location Shoulder;Lumbar Spine      Electrical Stimulation   Electrical Stimulation Location Lumbar    Electrical Stimulation Action IFC    Electrical Stimulation Parameters supine    Electrical Stimulation Goals Pain                    PT Short Term Goals - 05/08/20 1504      PT SHORT TERM GOAL #1   Title Pt will be independent with HEP    Time 2    Period Weeks    Status New    Target Date 05/22/20             PT Long Term Goals - 05/08/20 1504      PT LONG TERM GOAL #1   Title Pt will demo lumbar ROM <25% limited with no increase in LBP    Time 6  Period Weeks    Status New    Target Date 06/19/20      PT LONG TERM GOAL #2   Title Pt will demo functional R shoulder IR/ER equivalent to L    Time 6    Period Weeks    Status New    Target Date 06/19/20      PT LONG TERM GOAL #3   Title Pt will report able to complete household ADLs with no increase in R shoulder pain or LBP    Time 6    Period Weeks    Status New    Target Date 06/19/20      PT LONG TERM GOAL #4   Title Pt will demo able to >/=50# and carry across clinic in order to safely return to work    Time 6    Period Weeks    Status New    Target Date 06/19/20      PT LONG TERM GOAL #5   Title Pt will report able to work full day with </= 2/10 shoulder pain and LBP    Time 6    Period Weeks    Status New    Target Date 06/19/20                 Plan - 05/25/20 1655    Clinical Impression Statement no goals met. verb no doing HEP,.but verb wants to get moving. pt vague with pain and unable to rate but states can" do push ups but when it hurts it hurts". pt with decreased ROM and mvmt with all ex. 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    PT Treatment/Interventions  ADLs/Self Care Home Management;Cryotherapy;Electrical Stimulation;Moist Heat;Iontophoresis 4mg /ml Dexamethasone;Gait training;Stair training;Therapeutic activities;Therapeutic exercise;Neuromuscular re-education;Manual techniques;Patient/family education;Passive range of motion;Dry needling;Vasopneumatic Device;Taping    PT Next Visit Plan get pt moving, modalities/manual as indicated           Patient will benefit from skilled therapeutic intervention in order to improve the following deficits and impairments:  Abnormal gait, Decreased range of motion, Difficulty walking, Decreased endurance, Increased muscle spasms, Impaired UE functional use, Decreased activity tolerance, Pain, Hypomobility, Decreased strength, Postural dysfunction  Visit Diagnosis: Acute bilateral low back pain without sciatica  Acute pain of right shoulder  Cramp and spasm     Problem List Patient Active Problem List   Diagnosis Date Noted  . Essential hypertension 09/26/2017  . Paroxysmal atrial fibrillation ([place]) 09/24/2017    Eugina Row,ANGIE PTA 05/25/2020, 5:00 PM  [place]. [place], [place], [address] Phone: [phone]   Fax:  [phone]  Name: Corie Vavra MRN: [id number] Date of Birth: [birth year]

## 2020-05-26 ENCOUNTER — Encounter: Payer: Medicaid Other | Admitting: Physical Therapy

## 2020-05-29 ENCOUNTER — Ambulatory Visit: Payer: Medicaid Other | Admitting: Physical Therapy

## 2020-05-31 ENCOUNTER — Encounter: Payer: Medicaid Other | Admitting: Physical Therapy

## 2020-06-02 ENCOUNTER — Encounter: Payer: Medicaid Other | Admitting: Physical Therapy

## 2020-06-06 ENCOUNTER — Encounter: Payer: Self-pay | Admitting: Physical Therapy

## 2020-06-06 ENCOUNTER — Ambulatory Visit: Payer: Medicaid Other | Admitting: Physical Therapy

## 2020-06-06 ENCOUNTER — Other Ambulatory Visit: Payer: Self-pay

## 2020-06-06 DIAGNOSIS — R262 Difficulty in walking, not elsewhere classified: Secondary | ICD-10-CM

## 2020-06-06 DIAGNOSIS — R252 Cramp and spasm: Secondary | ICD-10-CM

## 2020-06-06 DIAGNOSIS — M25511 Pain in right shoulder: Secondary | ICD-10-CM

## 2020-06-06 DIAGNOSIS — M545 Low back pain, unspecified: Secondary | ICD-10-CM

## 2020-06-06 NOTE — Therapy (Signed)
[facility] [address] [place], [place], [address] Phone: [phone]   Fax:  [phone]  Physical Therapy Treatment  Patient Details  Name: Christian Schroeder MRN: [id number] Date of Birth: 1981/07/02 Referring Provider (PT): Lavetta Nielsen Date: 06/06/2020   PT End of Session - 06/06/20 1729    Visit Number 3    Date for PT Re-Evaluation 07/08/20    PT Start Time 1655    PT Stop Time 1740    PT Time Calculation (min) 45 min    Activity Tolerance Patient limited by pain    Behavior During Therapy [facility] for tasks assessed/performed           Past Medical History:  Diagnosis Date  . Asthma   . Atrial fibrillation (HCC)   . Essential hypertension 09/26/2017  . Hypertension     Past Surgical History:  Procedure Laterality Date  . CARDIAC SURGERY     cardioversion    There were no vitals filed for this visit.   Subjective Assessment - 06/06/20 1654    Subjective Pt states that shoulder pain is much better, back pain is slightly improved. Still having back spasms.    Currently in Pain? Yes    Pain Score 5     Pain Location Back                             [facility] Adult PT Treatment/Exercise - 06/06/20 0001      Lumbar Exercises: Stretches   Lower Trunk Rotation 5 reps;10 seconds    Lower Trunk Rotation Limitations limited ROM d/t pain      Lumbar Exercises: Aerobic   Nustep L5 x 6 min   cues to engage UE and LE     Lumbar Exercises: Supine   Bridge with Ball Squeeze Compliant;2 seconds;20 reps    Other Supine Lumbar Exercises dktc with ball x10      Knee/Hip Exercises: Supine   Straight Leg Raises Both;1 set;10 reps   answered facetime during exercise     Moist Heat Therapy   Number Minutes Moist Heat 15 Minutes    Moist Heat Location Lumbar Spine      Electrical Stimulation   Electrical Stimulation Location Lumbar    Electrical Stimulation Action IFC    Electrical Stimulation  Parameters supine    Electrical Stimulation Goals Pain                    PT Short Term Goals - 05/08/20 1504      PT SHORT TERM GOAL #1   Title Pt will be independent with HEP    Time 2    Period Weeks    Status New    Target Date 05/22/20             PT Long Term Goals - 06/06/20 1732      PT LONG TERM GOAL #1   Title Pt will demo lumbar ROM <25% limited with no increase in LBP    Time 6    Period Weeks    Status On-going      PT LONG TERM GOAL #2   Title Pt will demo functional R shoulder IR/ER equivalent to L    Time 6    Period Weeks    Status On-going      PT LONG TERM GOAL #3   Title Pt will report able to complete  household ADLs with no increase in R shoulder pain or LBP    Time 6    Period Weeks    Status On-going      PT LONG TERM GOAL #4   Title Pt will demo able to >/=50# and carry across clinic in order to safely return to work    Time 6    Period Weeks    Status On-going      PT LONG TERM GOAL #5   Title Pt will report able to work full day with </= 2/10 shoulder pain and LBP    Baseline reports able to work half day    Time 6    Period Weeks    Status On-going                 Plan - 06/06/20 1730    Clinical Impression Statement Pt reports difficulty getting to clinic for tx d/t care of daughter. Pt states that he has returned to gym and able to deadlift and bench press >200# with minimal shoulder pain. In clinic, pt very limited by c/o of pain. Difficulty with all activities esp SLR and dktc. Pt difficulty maintaining engagement/attention throughout rx. Educated on importance of HEP with pt VU.    PT Treatment/Interventions ADLs/Self Care Home Management;Cryotherapy;Electrical Stimulation;Moist Heat;Iontophoresis 4mg /ml Dexamethasone;Gait training;Stair training;Therapeutic activities;Therapeutic exercise;Neuromuscular re-education;Manual techniques;Patient/family education;Passive range of motion;Dry needling;Vasopneumatic  Device;Taping    PT Next Visit Plan get pt moving, modalities/manual as indicated    Consulted and Agree with Plan of Care Patient           Patient will benefit from skilled therapeutic intervention in order to improve the following deficits and impairments:  Abnormal gait, Decreased range of motion, Difficulty walking, Decreased endurance, Increased muscle spasms, Impaired UE functional use, Decreased activity tolerance, Pain, Hypomobility, Decreased strength, Postural dysfunction  Visit Diagnosis: Acute bilateral low back pain without sciatica  Acute pain of right shoulder  Cramp and spasm  Difficulty in walking, not elsewhere classified     Problem List Patient Active Problem List   Diagnosis Date Noted  . Essential hypertension 09/26/2017  . Paroxysmal atrial fibrillation (HCC) 09/24/2017   11/22/2017, PT, DPT Lysle Rubens Yeng Frankie 06/06/2020, 5:34 PM  [facility] [address] [place], [place], [place] Phone: [phone]   Fax:  [phone]  Name: Christian Schroeder MRN: Fredda Hammed Date of Birth: 08-13-1981

## 2020-06-08 ENCOUNTER — Ambulatory Visit: Payer: Medicaid Other | Admitting: Physical Therapy

## 2020-06-13 ENCOUNTER — Ambulatory Visit: Payer: Medicaid Other | Admitting: Physical Therapy

## 2020-06-15 ENCOUNTER — Ambulatory Visit: Payer: Medicaid Other | Admitting: Physical Therapy

## 2020-06-15 ENCOUNTER — Other Ambulatory Visit: Payer: Self-pay

## 2020-06-15 ENCOUNTER — Encounter: Payer: Self-pay | Admitting: Physical Therapy

## 2020-06-15 DIAGNOSIS — R262 Difficulty in walking, not elsewhere classified: Secondary | ICD-10-CM

## 2020-06-15 DIAGNOSIS — R252 Cramp and spasm: Secondary | ICD-10-CM

## 2020-06-15 DIAGNOSIS — M545 Low back pain, unspecified: Secondary | ICD-10-CM | POA: Diagnosis not present

## 2020-06-15 DIAGNOSIS — M25511 Pain in right shoulder: Secondary | ICD-10-CM

## 2020-06-15 NOTE — Therapy (Signed)
[facility] [address]. [place], [place], [address] Phone: [phone]   Fax:  [phone]  Physical Therapy Treatment  Patient Details  Name: Christian Schroeder MRN: [id number] Date of Birth: 05-Sep-1980 Referring Provider (PT): Lavetta Nielsen Date: 06/15/2020   PT End of Session - 06/15/20 1706    Visit Number 4    Date for PT Re-Evaluation 07/08/20    PT Start Time 1625    PT Stop Time 1704    PT Time Calculation (min) 39 min    Activity Tolerance Patient limited by pain    Behavior During Therapy [facility] for tasks assessed/performed           Past Medical History:  Diagnosis Date  . Asthma   . Atrial fibrillation (HCC)   . Essential hypertension 09/26/2017  . Hypertension     Past Surgical History:  Procedure Laterality Date  . CARDIAC SURGERY     cardioversion    There were no vitals filed for this visit.   Subjective Assessment - 06/15/20 1626    Subjective Patient reports that he tried to return to work last week.  He reports that he had incresae in low back pain.  He reports some increased numbness in his hands as well as reports erections at times that are not intended.  He has been attending therapy sporadically every 10 days or so.    Currently in Pain? Yes    Pain Score 6     Pain Location Back   right shoulder right knee   Pain Descriptors / Indicators Burning;Numbness   numbness in teh fingers and feet   Aggravating Factors  liftind, sitting              [facility] PT Assessment - 06/15/20 0001      Palpation   Palpation comment he is guarded and very tender to touch in the right upper trap, lumbar area and into the buttock                         [facility] Adult PT Treatment/Exercise - 06/15/20 0001      Lumbar Exercises: Machines for Strengthening   Cybex Knee Extension 10# 2x10    Cybex Knee Flexion 35# 2x10    Other Lumbar Machine Exercise 25# lats and rows 2x10                   PT Education - 06/15/20 1705    Education Details Reviewed HEP and what he tried at the gym, needed cues to really back off the weight and be sloe and work on form and be light with the weight    Person(s) Educated Patient    Methods Explanation    Comprehension Verbalized understanding            PT Short Term Goals - 05/08/20 1504      PT SHORT TERM GOAL #1   Title Pt will be independent with HEP    Time 2    Period Weeks    Status New    Target Date 05/22/20             PT Long Term Goals - 06/15/20 1730      PT LONG TERM GOAL #1   Title Pt will demo lumbar ROM <25% limited with no increase in LBP    Status On-going      PT LONG TERM GOAL #2  Title Pt will demo functional R shoulder IR/ER equivalent to L    Status On-going      PT LONG TERM GOAL #3   Title Pt will report able to complete household ADLs with no increase in R shoulder pain or LBP    Status On-going      PT LONG TERM GOAL #4   Title Pt will demo able to >/=50# and carry across clinic in order to safely return to work    Status On-going      PT LONG TERM GOAL #5   Title Pt will report able to work full day with </= 2/10 shoulder pain and LBP    Status On-going                 Plan - 06/15/20 1707    Clinical Impression Statement Patient has been sporadic with attendance, has been seen about every 10 days.  He does report some new symptoms, he reports some numbness in hands and feet which could be some issues due to spasms that could decreased some circulation.  He does report spontaneous erections, that he has had recently and he reports that this is upsetting when in public.  I suggested since that these are new symptoms that he should see the referring physician, as well as if he needs to see and orthopod or neurologist.  We tried some weights and he was able to do , but was very slow and seemed to be in pain even with very low weights    PT Next Visit Plan see if he makes  an appointment, try to continue gym activities    Consulted and Agree with Plan of Care Patient           Patient will benefit from skilled therapeutic intervention in order to improve the following deficits and impairments:  Abnormal gait, Decreased range of motion, Difficulty walking, Decreased endurance, Increased muscle spasms, Impaired UE functional use, Decreased activity tolerance, Pain, Hypomobility, Decreased strength, Postural dysfunction  Visit Diagnosis: Acute bilateral low back pain without sciatica  Acute pain of right shoulder  Cramp and spasm  Difficulty in walking, not elsewhere classified     Problem List Patient Active Problem List   Diagnosis Date Noted  . Essential hypertension 09/26/2017  . Paroxysmal atrial fibrillation (HCC) 09/24/2017    Jearld Lesch., PT 06/15/2020, 5:31 PM  [facility] [address] [place], [place], [address] Phone: [phone]   Fax:  [phone]  Name: Christian Schroeder MRN: [id number] Date of Birth: Aug 20, 1980

## 2020-08-22 ENCOUNTER — Encounter (HOSPITAL_BASED_OUTPATIENT_CLINIC_OR_DEPARTMENT_OTHER): Payer: Self-pay

## 2020-08-22 ENCOUNTER — Emergency Department (HOSPITAL_BASED_OUTPATIENT_CLINIC_OR_DEPARTMENT_OTHER): Payer: Medicaid Other

## 2020-08-22 ENCOUNTER — Emergency Department (HOSPITAL_BASED_OUTPATIENT_CLINIC_OR_DEPARTMENT_OTHER)
Admission: EM | Admit: 2020-08-22 | Discharge: 2020-08-23 | Disposition: A | Discharge status: Home / Self Care | Payer: Medicaid Other | Attending: Emergency Medicine | Admitting: Emergency Medicine

## 2020-08-22 ENCOUNTER — Other Ambulatory Visit: Payer: Self-pay

## 2020-08-22 DIAGNOSIS — R079 Chest pain, unspecified: Secondary | ICD-10-CM | POA: Diagnosis present

## 2020-08-22 DIAGNOSIS — I10 Essential (primary) hypertension: Secondary | ICD-10-CM | POA: Insufficient documentation

## 2020-08-22 DIAGNOSIS — U071 COVID-19: Secondary | ICD-10-CM | POA: Diagnosis not present

## 2020-08-22 DIAGNOSIS — J45909 Unspecified asthma, uncomplicated: Secondary | ICD-10-CM | POA: Insufficient documentation

## 2020-08-22 DIAGNOSIS — F17210 Nicotine dependence, cigarettes, uncomplicated: Secondary | ICD-10-CM | POA: Insufficient documentation

## 2020-08-22 LAB — CBC
HCT: 46.1 % (ref 39.0–52.0)
Hemoglobin: 15.1 g/dL (ref 13.0–17.0)
MCH: 27.7 pg (ref 26.0–34.0)
MCHC: 32.8 g/dL (ref 30.0–36.0)
MCV: 84.6 fL (ref 80.0–100.0)
Platelets: 255 10*3/uL (ref 150–400)
RBC: 5.45 MIL/uL (ref 4.22–5.81)
RDW: 15.1 % (ref 11.5–15.5)
WBC: 4 10*3/uL (ref 4.0–10.5)
nRBC: 0 % (ref 0.0–0.2)

## 2020-08-22 LAB — BASIC METABOLIC PANEL
Anion gap: 10 (ref 5–15)
BUN: 12 mg/dL (ref 6–20)
CO2: 28 mmol/L (ref 22–32)
Calcium: 9.1 mg/dL (ref 8.9–10.3)
Chloride: 99 mmol/L (ref 98–111)
Creatinine, Ser: 1.11 mg/dL (ref 0.61–1.24)
GFR, Estimated: >60 mL/min (ref >60)
Glucose, Bld: 110 mg/dL — ABNORMAL HIGH (ref 70–99)
Potassium: 3 mmol/L — ABNORMAL LOW (ref 3.5–5.1)
Sodium: 137 mmol/L (ref 135–145)

## 2020-08-22 LAB — TROPONIN I (HIGH SENSITIVITY): Troponin I (High Sensitivity): 11 ng/L (ref <18)

## 2020-08-22 NOTE — ED Triage Notes (Signed)
Cardiac pt, body aches that began yesterday, took tylenol with relief. Chills. Feels like his heart is not 'beating right'. Some lightheadedness, no SOB or dizziness. Has no followed up on cardiology appointment in over a year. No PCP

## 2020-08-22 NOTE — ED Notes (Signed)
PATIENT REQUESTED TO SIT IN CAR DUE TO FULL WAITING ROOM WILL ANSWER CELL PHONE WHEN READY TO BE SEEN [phone]

## 2020-08-23 ENCOUNTER — Emergency Department (HOSPITAL_BASED_OUTPATIENT_CLINIC_OR_DEPARTMENT_OTHER): Payer: Medicaid Other

## 2020-08-23 LAB — TROPONIN I (HIGH SENSITIVITY): Troponin I (High Sensitivity): 14 ng/L (ref <18)

## 2020-08-23 LAB — SARS CORONAVIRUS 2 BY RT PCR (HOSPITAL ORDER, PERFORMED IN ~~LOC~~ HOSPITAL LAB): SARS Coronavirus 2: POSITIVE — AB

## 2020-08-23 NOTE — Discharge Instructions (Signed)
You were evaluated in the Emergency Department and after careful evaluation, we did not find any emergent condition requiring admission or further testing in the hospital.  Your exam/testing today is overall reassuring.  No evidence of heart damage or heart attack.  Symptoms seem to be due to a viral illness, possibly the coronavirus.  We have tested you for the coronavirus here in the Emergency Department.  Please isolate or quarantine at home until you receive a negative test result.  If positive, we recommend continued home quarantine per [facility] recommendations.   Please return to the Emergency Department if you experience any worsening of your condition.   Thank you for allowing [place] to be a part of your care.

## 2020-08-23 NOTE — ED Notes (Signed)
Xray at the bedside.

## 2020-08-23 NOTE — ED Provider Notes (Signed)
[facility] Emergency Department Provider Note MRN:  [id number]  Arrival date & time: 08/23/20     Chief Complaint   Chest Pain   History of Present Illness   Christian Schroeder is a 40 y.o. year-old male with a history of A. fib, hypertension presenting to the ED with chief complaint of chest pain.  Fever malaise, body soreness for the past 3 days.  Felt a funny feeling in his chest earlier today like his heart was beating irregularly, also had a sharp pain in the left side of the chest that lasted about 5 seconds and went away.  Has had a few family members with the flu this week.  Denies shortness of breath, no abdominal pain, no pain or swelling to the legs.  Symptoms intermittent, mild to moderate.  Review of Systems  A complete 10 system review of systems was obtained and all systems are negative except as noted in the HPI and PMH.   Patient's Health History    Past Medical History:  Diagnosis Date  . Asthma   . Atrial fibrillation (HCC)   . Essential hypertension 09/26/2017  . Hypertension     Past Surgical History:  Procedure Laterality Date  . CARDIAC SURGERY     cardioversion    Family History  Problem Relation Age of Onset  . Kidney disease Mother   . Hypertension Mother   . Hypertension Father   . Kidney disease Brother     Social History   Socioeconomic History  . Marital status: Single    Spouse name: Not on file  . Number of children: Not on file  . Years of education: Not on file  . Highest education level: Not on file  Occupational History  . Not on file  Tobacco Use  . Smoking status: Light Tobacco Smoker    Packs/day: 1.00    Types: Cigarettes    Last attempt to quit: 05/30/2018    Years since quitting: 2.2  . Smokeless tobacco: Current User  . Tobacco comment: 2 cigerettes a day  Vaping Use  . Vaping Use: Never used  Substance and Sexual Activity  . Alcohol use: Not Currently    Comment: occ  . Drug use: Not  Currently    Types: Marijuana  . Sexual activity: Not on file  Other Topics Concern  . Not on file  Social History Narrative  . Not on file   Social Determinants of Health   Financial Resource Strain: Not on file  Food Insecurity: Not on file  Transportation Needs: Not on file  Physical Activity: Not on file  Stress: Not on file  Social Connections: Not on file  Intimate Partner Violence: Not on file     Physical Exam   Vitals:   08/22/20 1953 08/22/20 2352  BP: (!) 169/102 (!) 186/108  Pulse: 80 68  Resp: 15 18  Temp: 99 F (37.2 C) 98.8 F (37.1 C)  SpO2: 99% 100%    CONSTITUTIONAL: Well-appearing, NAD NEURO:  Alert and oriented x 3, no focal deficits EYES:  eyes equal and reactive ENT/NECK:  no LAD, no JVD CARDIO: Regular rate, well-perfused, normal S1 and S2 PULM:  CTAB no wheezing or rhonchi GI/GU:  normal bowel sounds, non-distended, non-tender MSK/SPINE:  No gross deformities, no edema SKIN:  no rash, atraumatic PSYCH:  Appropriate speech and behavior  *Additional and/or pertinent findings included in MDM below  Diagnostic and Interventional Summary    EKG Interpretation  Date/Time:  Tuesday August 22 2020 19:52:43 EST Ventricular Rate:  70 PR Interval:  174 QRS Duration: 114 QT Interval:  368 QTC Calculation: 397 R Axis:   85 Text Interpretation: Normal sinus rhythm with sinus arrhythmia Left ventricular hypertrophy with repolarization abnormality ( Sokolow-Lyon ) Abnormal ECG Confirmed by Kennis Carina [phone]) on 08/22/2020 11:53:45 PM      Labs Reviewed  BASIC METABOLIC PANEL - Abnormal; Notable for the following components:      Result Value   Potassium 3.0 (*)    Glucose, Bld 110 (*)    All other components within normal limits  SARS CORONAVIRUS 2 (TAT 6-24 HRS)  CBC  TROPONIN I (HIGH SENSITIVITY)  TROPONIN I (HIGH SENSITIVITY)    DG Chest Port 1 View  Final Result      Medications - No data to display   Procedures  /  Critical  Care Procedures  ED Course and Medical Decision Making  I have reviewed the triage vital signs, the nursing notes, and pertinent available records from the EMR.  Listed above are laboratory and imaging tests that I personally ordered, reviewed, and interpreted and then considered in my medical decision making (see below for details).  Suspect viral illness, no evidence of DVT, no hypoxia, no tachycardia, doubt PE.  EKG is with some nonspecific findings.  First troponin negative, awaiting second.  Anticipating discharge.     Second troponin is negative, appropriate for discharge.  Elmer Sow. Pilar Plate, MD [facility] [email or web address]  Final Clinical Impressions(s) / ED Diagnoses     ICD-10-CM   1. Chest pain, unspecified type  R07.9     ED Discharge Orders    None       Discharge Instructions Discussed with and Provided to Patient:     Discharge Instructions     You were evaluated in the Emergency Department and after careful evaluation, we did not find any emergent condition requiring admission or further testing in the hospital.  Your exam/testing today is overall reassuring.  No evidence of heart damage or heart attack.  Symptoms seem to be due to a viral illness, possibly the coronavirus.  We have tested you for the coronavirus here in the Emergency Department.  Please isolate or quarantine at home until you receive a negative test result.  If positive, we recommend continued home quarantine per [facility] recommendations.   Please return to the Emergency Department if you experience any worsening of your condition.   Thank you for allowing [place] to be a part of your care.       Sabas Sous, MD 08/23/20 [phone]

## 2020-08-25 ENCOUNTER — Other Ambulatory Visit: Payer: Self-pay | Admitting: Cardiovascular Disease

## 2020-10-12 ENCOUNTER — Other Ambulatory Visit: Payer: Self-pay | Admitting: Cardiovascular Disease

## 2020-11-03 ENCOUNTER — Other Ambulatory Visit: Payer: Self-pay | Admitting: Cardiovascular Disease

## 2020-11-06 ENCOUNTER — Other Ambulatory Visit: Payer: Self-pay

## 2020-11-30 ENCOUNTER — Other Ambulatory Visit: Payer: Self-pay | Admitting: Cardiovascular Disease

## 2020-12-07 ENCOUNTER — Other Ambulatory Visit: Payer: Self-pay

## 2020-12-07 ENCOUNTER — Telehealth: Payer: Self-pay | Admitting: Cardiovascular Disease

## 2020-12-07 MED ORDER — FLECAINIDE ACETATE 50 MG PO TABS: 50.0000 mg | ORAL_TABLET | Freq: Two times a day (BID) | ORAL | 0 refills | Status: DC

## 2020-12-07 MED ORDER — FLECAINIDE ACETATE 50 MG PO TABS: 50.0000 mg | ORAL_TABLET | Freq: Two times a day (BID) | ORAL | 3 refills | Status: DC

## 2020-12-07 NOTE — Telephone Encounter (Signed)
*  STAT* If patient is at the pharmacy, call can be transferred to refill team.   1. Which medications need to be refilled? (please list name of each medication and dose if known)  flecainide (TAMBOCOR) 50 MG tablet  2. Which pharmacy/location (including street and city if local pharmacy) is medication to be sent to? [facility] [address], [place] - [address]  3. Do they need a 30 day or 90 day supply? 30 day supply

## 2020-12-13 ENCOUNTER — Telehealth: Payer: Self-pay | Admitting: Cardiovascular Disease

## 2020-12-13 MED ORDER — FLECAINIDE ACETATE 50 MG PO TABS: 50.0000 mg | ORAL_TABLET | Freq: Two times a day (BID) | ORAL | 0 refills | Status: DC

## 2020-12-13 NOTE — Telephone Encounter (Signed)
30 day supply sent to desired pharmacy.

## 2020-12-13 NOTE — Telephone Encounter (Signed)
*  STAT* If patient is at the pharmacy, call can be transferred to refill team.   1. Which medications need to be refilled? (please list name of each medication and dose if known) Flecainide 2. Which pharmacy/location (including street and city if local pharmacy) is medication to be sent to? [facility]  3. Do they need a 30 day or 90 day supply?30 days

## 2020-12-18 NOTE — Telephone Encounter (Signed)
*  STAT* If patient is at the pharmacy, call can be transferred to refill team.   1. Which medications need to be refilled? (please list name of each medication and dose if known)  apixaban (ELIQUIS) 5 MG TABS tablet flecainide (TAMBOCOR) 50 MG tablet  2. Which pharmacy/location (including street and city if local pharmacy) is medication to be sent to? [facility] [address] [place] - [address]  3. Do they need a 30 day or 90 day supply? 30 day   Patient is completely out of the medications. States he has been out for a while.

## 2020-12-19 ENCOUNTER — Other Ambulatory Visit: Payer: Self-pay

## 2020-12-19 MED ORDER — FLECAINIDE ACETATE 50 MG PO TABS: 50.0000 mg | ORAL_TABLET | Freq: Two times a day (BID) | ORAL | 0 refills | Status: DC

## 2020-12-19 MED ORDER — LISINOPRIL 10 MG PO TABS: 10.0000 mg | ORAL_TABLET | Freq: Every day | ORAL | 0 refills | Status: DC

## 2020-12-19 MED ORDER — APIXABAN 5 MG PO TABS: 5.0000 mg | ORAL_TABLET | Freq: Two times a day (BID) | ORAL | 0 refills | Status: DC

## 2021-01-08 ENCOUNTER — Ambulatory Visit: Payer: Medicaid Other | Admitting: Cardiovascular Disease

## 2021-01-08 NOTE — Progress Notes (Incomplete)
Virtual Visit via *** Note:  {Choose 1 Note Type (Video or Telephone):[phone]}   The patient was identified using 2 identifiers.  Date:  01/08/2021   ID:  Christian Schroeder, DOB [birth year], MRN [id number]  {Patient [phone]::"Home"} {Provider [phone]::"Home Office"}  PCP:  Patient, No Pcp Per (Inactive)  Cardiologist:  Chilton Si, MD *** Electrophysiologist:  None   Evaluation Performed:  {Choose Visit [phone]::"Follow-Up Visit"}  Chief Complaint:  ***  History of Present Illness:    Christian Schroeder is a 40 y.o. male with asthma and paroxysmal atrial fibrillation here for follow up.  He has a history of atrial fibrillation and underwent TEE/DCCV at age 72.  He subsequently underwent ablation while living in [place].  He was awakened from sleep 04/2017 with recurrent atrial fibrillation.  He was seen in the ED and his heart rate was 193 bpm.  When in atrial fibrillation he felt like his heart was racing and it was difficult to breathe.  He underwent DCCV and followed up in atrial fibrillation clinic 04/2017.  He was started on diltiazem and given a 30-day prescription of Eliquis.  He had an echo 04/2017 that revealed LVEF 55-60% and mildly dilated left atrium.  He was also referred for sleep study but has not yet had it.  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  He cancelled the sleep study 3 times due to work obligations.  Since his last appointment Mr. Heal quit smoking.  He hasn't smoked in over two months.  Since then he gained 25lb.  He is a single father and cares for his daughter.  He is trying to be healthier for her.  He plans to start a sugar fast next year.  He has been working out at night 3 nights per week.  He lifts weights, does aerobics and walks on the treadmill.  He gets tired but has no chest pain or shortness of breath. His BP at home has mostly been around 140 systolic.  He denies lower extremity edema, orthopnea  or PND.   Today,  He denies any chest pain, shortness of breath, palpitations, or exertional symptoms. No headaches, lightheadedness, or syncope to report. Also has no lower extremity edema, orthopnea or PND.  The patient {does/does not:200015} have symptoms concerning for COVID-19 infection (fever, chills, cough, or new shortness of breath).  Past Medical History:  Diagnosis Date   Asthma    Atrial fibrillation (HCC)    Essential hypertension 09/26/2017   Hypertension     Past Surgical History:  Procedure Laterality Date   CARDIAC SURGERY     cardioversion     Current Outpatient Medications  Medication Sig Dispense Refill   apixaban (ELIQUIS) 5 MG TABS tablet Take 1 tablet (5 mg total) by mouth 2 (two) times daily. 60 tablet 0   diltiazem (CARDIZEM CD) 240 MG 24 hr capsule Take 1 capsule (240 mg total) by mouth daily. 90 capsule 3   flecainide (TAMBOCOR) 50 MG tablet Take 1 tablet (50 mg total) by mouth 2 (two) times daily. 60 tablet 0   hydrochlorothiazide (HYDRODIURIL) 25 MG tablet Take 1 tablet (25 mg total) by mouth daily. AS NEEDED FOR BLOOD PRESSURE (Patient taking differently: Take 50 mg by mouth daily. AS NEEDED FOR BLOOD PRESSURE ) 90 tablet 3   lisinopril (ZESTRIL) 10 MG tablet Take 1 tablet (10 mg total) by mouth daily. 30 tablet 0   No current facility-administered medications for this visit.  Allergies:   Latex    Social History:  The patient  reports that he has been smoking cigarettes. He has been smoking about 1.00 pack per day. He uses smokeless tobacco. He reports previous alcohol use. He reports previous drug use. Drug: Marijuana.   Family History:  The patient's family history includes Hypertension in his father and mother; Kidney disease in his brother and mother.    ROS:   Please see the history of present illness. (+) All other systems are reviewed and negative.    PHYSICAL EXAM: There were no vitals taken for this visit. GENERAL:  Well-appearing.  No acute distress. HEENT: Pupils equal round.  Oral mucosa unremarkable NECK:  No jugular venous distention, no visible thyromegaly EXT:  No edema, no cyanosis no clubbing SKIN:  No rashes no nodules NEURO:  Speech fluent.  Cranial nerves grossly intact.  Moves all 4 extremities freely PSYCH:  Cognitively intact, oriented to person place and time   EKG:   01/08/2021: EKG is not ordered today. 07/30/2018: EKG is not ordered today. 09/24/2017: sinus rhythm.  Rate 69 bpm.  LVH with secondary repolarization abnormality  Cardioversion 09/09/2019: MDM Rules/Calculators/A&P: 40 year old male with atrial fibrillation.  Arrived with rapid ventricular rate.  History of the same.  Symptom onset just a few hours prior to arrival.  Initially given Cardizem with improvement of rate and has resolution of symptoms.  He remained in A. fib with a heart rate of approximately 100 120 though.  He was cardioverted successfully.  He was advised to take his medications regularly and follow back up with his cardiologist, Dr. Duke Salvia.    Final diagnoses:  Atrial fibrillation with RVR (HCC)   Echo 05/09/17: Study Conclusions  - Left ventricle: GLSS is normal at -20% The cavity size was   normal. There was moderate concentric hypertrophy. Systolic   function was normal. The estimated ejection fraction was in the   range of 55% to 60%. Wall motion was normal; there were no   regional wall motion abnormalities. Left ventricular diastolic   function parameters were normal. - Left atrium: The atrium was mildly dilated  Recent Labs: 08/22/2020: BUN 12; Creatinine, Ser 1.11; Hemoglobin 15.1; Platelets 255; Potassium 3.0; Sodium 137    Lipid Panel No results found for: CHOL, TRIG, HDL, CHOLHDL, VLDL, LDLCALC, LDLDIRECT    Wt Readings from Last 3 Encounters:  08/22/20 280 lb (127 kg)  01/11/20 285 lb (129.3 kg)  09/15/19 (!) 302 lb 12.8 oz (137.3 kg)      ASSESSMENT AND PLAN: No  problem-specific Assessment & Plan notes found for this encounter.   # Paroxysmal atrial fibrillation:  [name] has remained in sinus rhythm since his cardioversion.  He is doing well.  Continue diltiazem.  He is not on anticoagulation due to CHA2DS2-Vasc score of 0.  Given that his BP is elevated, we would now consider him to have a score of 1.  He would like to try to get his BP down before starting any new medications.  Will discuss at follow up. Sleep study still pending.   # Hypertension: Mr. Hupfer' blood pressure is much better.  Continue diltiazem, HCTZ and lisinopril.  He was congratulated on his exercise routine.  # Tobacco abuse: Mr. Hands was congratulated on 2 months of abstaining from tobacco.     COVID-19 Education: The signs and symptoms of COVID-19 were discussed with the patient and how to seek care for testing (follow up with PCP or arrange E-visit).  ***  The importance of social distancing was discussed today.  Time:   Today, I have spent *** minutes with the patient with telehealth technology discussing the above problems.     Current medicines are reviewed at length with the patient today.  The patient does not have concerns regarding medicines.  The following changes have been made:  no change  Labs/ tests ordered today include:   No orders of the defined types were placed in this encounter.    Disposition:   FU with Tiffany C. Duke Salvia, MD, [facility] in *** months.      Signed, Tiffany C. Duke Salvia, MD, [facility]  01/08/2021 8:36 AM    [facility]

## 2021-01-12 ENCOUNTER — Ambulatory Visit: Payer: Medicaid Other | Admitting: Cardiovascular Disease

## 2021-01-12 NOTE — Progress Notes (Incomplete)
Cardiology Office Note   Date:  01/12/2021   ID:  Selden Noteboom, DOB 03/30/1981, MRN [id number]  PCP:  Patient, No Pcp Per (Inactive)  Cardiologist:   Carlena Bjornstad   No chief complaint on file.    History of Present Illness: Christian Schroeder is a 40 y.o. male with asthma and paroxysmal atrial fibrillation here for follow up.  He has a history of atrial fibrillation and underwent TEE/DCCV at age 78.  He subsequently underwent ablation while living in [place].  He was awakened from sleep 04/2017 with recurrent atrial fibrillation.  He was seen in the ED and his heart rate was 193 bpm.  When in atrial fibrillation he felt like his heart was racing and it was difficult to breathe.  He underwent DCCV and followed up in atrial fibrillation clinic 04/2017.  He was started on diltiazem and given a 30-day prescription of Eliquis.  He had an echo 04/2017 that revealed LVEF 55-60% and mildly dilated left atrium.  He was also referred for sleep study but has not yet had it.  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  He cancelled the sleep study 3 times due to work obligations.  Since his last appointment Mr. Derasmo quit smoking.  He hasn't smoked in over two months.  Since then he gained 25lb.  He is a single father and cares for his daughter.  He is trying to be healthier for her.  He plans to start a sugar fast next year.  He has been working out at night 3 nights per week.  He lifts weights, does aerobics and walks on the treadmill.  He gets tired but has no chest pain or shortness of breath. His BP at home has mostly been around 140 systolic.  He denies lower extremity edema, orthopnea or PND.   Today,  He denies any chest pain, shortness of breath, palpitations, or exertional symptoms. No headaches, lightheadedness, or syncope to report. Also has no lower extremity edema, orthopnea or PND.   Past Medical History:  Diagnosis Date   Asthma    Atrial fibrillation (HCC)     Essential hypertension 09/26/2017   Hypertension     Past Surgical History:  Procedure Laterality Date   CARDIAC SURGERY     cardioversion     Current Outpatient Medications  Medication Sig Dispense Refill   apixaban (ELIQUIS) 5 MG TABS tablet Take 1 tablet (5 mg total) by mouth 2 (two) times daily. 60 tablet 0   diltiazem (CARDIZEM CD) 240 MG 24 hr capsule Take 1 capsule (240 mg total) by mouth daily. 90 capsule 3   flecainide (TAMBOCOR) 50 MG tablet Take 1 tablet (50 mg total) by mouth 2 (two) times daily. 60 tablet 0   hydrochlorothiazide (HYDRODIURIL) 25 MG tablet Take 1 tablet (25 mg total) by mouth daily. AS NEEDED FOR BLOOD PRESSURE (Patient taking differently: Take 50 mg by mouth daily. AS NEEDED FOR BLOOD PRESSURE ) 90 tablet 3   lisinopril (ZESTRIL) 10 MG tablet Take 1 tablet (10 mg total) by mouth daily. 30 tablet 0   No current facility-administered medications for this visit.    Allergies:   Latex    Social History:  The patient  reports that he has been smoking cigarettes. He has been smoking about 1.00 pack per day. He uses smokeless tobacco. He reports previous alcohol use. He reports previous drug use. Drug: Marijuana.   Family History:  The patient's family  history includes Hypertension in his father and mother; Kidney disease in his brother and mother.    ROS:   Please see the history of present illness. (+) All other systems are reviewed and negative.     PHYSICAL EXAM: VS:  There were no vitals taken for this visit. , BMI There is no height or weight on file to calculate BMI. GENERAL:  Well appearing.  No acute distress. HEENT:  Pupils equal round and reactive, fundi not visualized, oral mucosa unremarkable NECK:  No jugular venous distention, waveform within normal limits, carotid upstroke brisk and symmetric, no bruits, no thyromegaly LUNGS:  Clear to auscultation bilaterally HEART:  RRR.  PMI not displaced or sustained,S1 and S2 within normal  limits, no S3, no S4, no clicks, no rubs, no murmurs ABD:  Flat, positive bowel sounds normal in frequency in pitch, no bruits, no rebound, no guarding, no midline pulsatile mass, no hepatomegaly, no splenomegaly EXT:  2 plus pulses throughout, no edema, no cyanosis no clubbing SKIN:  No rashes no nodules NEURO:  Cranial nerves II through XII grossly intact, motor grossly intact throughout PSYCH:  Cognitively intact, oriented to person place and time   EKG:   01/12/2021: *** 09/24/17: sinus rhythm.  Rate 69 bpm.  LVH with secondary repolarization abnormality  Echo 05/09/17: Study Conclusions  - Left ventricle: GLSS is normal at -20% The cavity size was   normal. There was moderate concentric hypertrophy. Systolic   function was normal. The estimated ejection fraction was in the   range of 55% to 60%. Wall motion was normal; there were no   regional wall motion abnormalities. Left ventricular diastolic   function parameters were normal. - Left atrium: The atrium was mildly dilated  Recent Labs: 08/22/2020: BUN 12; Creatinine, Ser 1.11; Hemoglobin 15.1; Platelets 255; Potassium 3.0; Sodium 137    Lipid Panel No results found for: CHOL, TRIG, HDL, CHOLHDL, VLDL, LDLCALC, LDLDIRECT    Wt Readings from Last 3 Encounters:  08/22/20 280 lb (127 kg)  01/11/20 285 lb (129.3 kg)  09/15/19 (!) 302 lb 12.8 oz (137.3 kg)      ASSESSMENT AND PLAN: No problem-specific Assessment & Plan notes found for this encounter.   # Paroxysmal atrial fibrillation:  Mr. Moya has remained in sinus rhythm since his cardioversion.  He is doing well.  Continue diltiazem.  He is not on anticoagulation due to CHA2DS2-Vasc score of 0.  Given that his BP is elevated, we would now consider him to have a score of 1.  He would like to try to get his BP down before starting any new medications.  Will discuss at follow up. Sleep study still pending.   # Hypertension: Mr. Sahlin' blood pressure is much better.   Continue diltiazem, HCTZ and lisinopril.  He was congratulated on his exercise routine.  # Tobacco abuse: Mr. Milian was congratulated on 2 months of abstaining from tobacco.        Current medicines are reviewed at length with the patient today.  The patient does not have concerns regarding medicines.  The following changes have been made:  no change  Labs/ tests ordered today include:   No orders of the defined types were placed in this encounter.    Disposition:   FU with Tiffany C. Duke Salvia, MD, [facility] in *** months.    I,Mathew Stumpf,acting as a Neurosurgeon for Chilton Si, MD.,have documented all relevant documentation on the behalf of Chilton Si, MD,as directed by  Chilton Si,  MD while in the presence of Chilton Si, MD.  ***  Signed, Tiffany C. Duke Salvia, MD, [facility]  01/12/2021 8:55 AM    [facility]

## 2021-01-16 ENCOUNTER — Other Ambulatory Visit: Payer: Self-pay | Admitting: Cardiovascular Disease

## 2021-01-19 ENCOUNTER — Ambulatory Visit: Payer: Medicaid Other | Admitting: Cardiovascular Disease

## 2021-01-19 NOTE — Progress Notes (Incomplete)
Cardiology Office Note   Date:  01/19/2021   ID:  Christian Schroeder, DOB 1980-09-17, MRN [id number]  PCP:  Patient, No Pcp Per (Inactive)  Cardiologist:   Carlena Bjornstad   No chief complaint on file.    History of Present Illness: Christian Schroeder is a 40 y.o. male with asthma, hypertension, prior tobacco abuse, and paroxysmal atrial fibrillation here for follow up.  He has a history of atrial fibrillation and underwent TEE/DCCV at age 61.  He subsequently underwent ablation while living in [place].  He was awakened from sleep 04/2017 with recurrent atrial fibrillation.  He was seen in the ED and his heart rate was 193 bpm.  When in atrial fibrillation he felt like his heart was racing and it was difficult to breathe.  He underwent DCCV and followed up in atrial fibrillation clinic 04/2017.  He was started on diltiazem and given a 30-day prescription of Eliquis.  He had an echo 04/2017 that revealed LVEF 55-60% and mildly dilated left atrium.  He was also referred for sleep study but has not yet had it.  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  He cancelled the sleep study 3 times due to work obligations.  At his last appointment, [name] had recently quit smoking. His blood pressure was above goal, but he wanted to work on diet and exercise before starting medicine. He was referred for a sleep study.   Today,  He denies any chest pain, shortness of breath, palpitations, or exertional symptoms. No headaches, lightheadedness, or syncope to report. Also has no lower extremity edema, orthopnea or PND.    Past Medical History:  Diagnosis Date   Asthma    Atrial fibrillation (HCC)    Essential hypertension 09/26/2017   Hypertension     Past Surgical History:  Procedure Laterality Date   CARDIAC SURGERY     cardioversion     Current Outpatient Medications  Medication Sig Dispense Refill   apixaban (ELIQUIS) 5 MG TABS tablet Take 1 tablet (5 mg total) by mouth 2  (two) times daily. 60 tablet 0   diltiazem (CARDIZEM CD) 240 MG 24 hr capsule Take 1 capsule (240 mg total) by mouth daily. 90 capsule 3   flecainide (TAMBOCOR) 50 MG tablet Take 1 tablet by mouth twice daily 60 tablet 0   hydrochlorothiazide (HYDRODIURIL) 25 MG tablet Take 1 tablet (25 mg total) by mouth daily. AS NEEDED FOR BLOOD PRESSURE (Patient taking differently: Take 50 mg by mouth daily. AS NEEDED FOR BLOOD PRESSURE ) 90 tablet 3   lisinopril (ZESTRIL) 10 MG tablet Take 1 tablet (10 mg total) by mouth daily. 30 tablet 0   No current facility-administered medications for this visit.    Allergies:   Latex    Social History:  The patient  reports that he has been smoking cigarettes. He has been smoking about 1.00 pack per day. He uses smokeless tobacco. He reports previous alcohol use. He reports previous drug use. Drug: Marijuana.   Family History:  The patient's family history includes Hypertension in his father and mother; Kidney disease in his brother and mother.    ROS:   Please see the history of present illness. (+) All other systems are reviewed and negative.     PHYSICAL EXAM: VS:  There were no vitals taken for this visit. , BMI There is no height or weight on file to calculate BMI. GENERAL:  Well appearing.  No acute  distress. HEENT:  Pupils equal round and reactive, fundi not visualized, oral mucosa unremarkable NECK:  No jugular venous distention, waveform within normal limits, carotid upstroke brisk and symmetric, no bruits, no thyromegaly LUNGS:  Clear to auscultation bilaterally HEART:  RRR.  PMI not displaced or sustained,S1 and S2 within normal limits, no S3, no S4, no clicks, no rubs, no murmurs ABD:  Flat, positive bowel sounds normal in frequency in pitch, no bruits, no rebound, no guarding, no midline pulsatile mass, no hepatomegaly, no splenomegaly EXT:  2 plus pulses throughout, no edema, no cyanosis no clubbing SKIN:  No rashes no nodules NEURO:   Cranial nerves II through XII grossly intact, motor grossly intact throughout PSYCH:  Cognitively intact, oriented to person place and time   EKG:   01/19/2021: *** 09/24/17: sinus rhythm.  Rate 69 bpm.  LVH with secondary repolarization abnormality  Echo 05/09/17: Study Conclusions  - Left ventricle: GLSS is normal at -20% The cavity size was   normal. There was moderate concentric hypertrophy. Systolic   function was normal. The estimated ejection fraction was in the   range of 55% to 60%. Wall motion was normal; there were no   regional wall motion abnormalities. Left ventricular diastolic   function parameters were normal. - Left atrium: The atrium was mildly dilated  Recent Labs: 08/22/2020: BUN 12; Creatinine, Ser 1.11; Hemoglobin 15.1; Platelets 255; Potassium 3.0; Sodium 137    Lipid Panel No results found for: CHOL, TRIG, HDL, CHOLHDL, VLDL, LDLCALC, LDLDIRECT    Wt Readings from Last 3 Encounters:  08/22/20 280 lb (127 kg)  01/11/20 285 lb (129.3 kg)  09/15/19 (!) 302 lb 12.8 oz (137.3 kg)      ASSESSMENT AND PLAN: No problem-specific Assessment & Plan notes found for this encounter.   # Paroxysmal atrial fibrillation:  [name] has remained in sinus rhythm since his cardioversion.  He is doing well.  Continue diltiazem.  He is not on anticoagulation due to CHA2DS2-Vasc score of 0.  Given that his BP is elevated, we would now consider him to have a score of 1.  He would like to try to get his BP down before starting any new medications.  Will discuss at follow up. Sleep study still pending.   # Hypertension: [name] blood pressure is much better.  Continue diltiazem, HCTZ and lisinopril.  He was congratulated on his exercise routine.  # Tobacco abuse: [name] was congratulated on 2 months of abstaining from tobacco.        Current medicines are reviewed at length with the patient today.  The patient does not have concerns regarding medicines.  The following  changes have been made:  no change  Labs/ tests ordered today include:   No orders of the defined types were placed in this encounter.    Disposition:   FU with Tiffany C. Duke Salvia, MD, [facility] in ***3 months.    I,Mathew Stumpf,acting as a Neurosurgeon for Chilton Si, MD.,have documented all relevant documentation on the behalf of Chilton Si, MD,as directed by  Chilton Si, MD while in the presence of Chilton Si, MD.  ***  Signed, Tiffany C. Duke Salvia, MD, [facility]  01/19/2021 8:40 AM    [facility]

## 2021-02-02 ENCOUNTER — Telehealth: Payer: Self-pay | Admitting: Cardiovascular Disease

## 2021-02-02 MED ORDER — LISINOPRIL 10 MG PO TABS: 10.0000 mg | ORAL_TABLET | Freq: Every day | ORAL | 0 refills | Status: DC

## 2021-02-02 NOTE — Telephone Encounter (Signed)
*  STAT* If patient is at the pharmacy, call can be transferred to refill team.   1. Which medications need to be refilled? (please list name of each medication and dose if known)   Lisinopril  2. Which pharmacy/location (including street and city if local pharmacy) is medication to be sent to? [facility] [address] [place], [place]  3. Do they need a 30 day or 90 day supply? 90 days and refills- Need today please- he is completely out of it

## 2021-02-24 ENCOUNTER — Other Ambulatory Visit: Payer: Self-pay | Admitting: Cardiovascular Disease

## 2021-02-25 NOTE — Telephone Encounter (Signed)
Please schedule for overdue follow up with Dr. [place] or APP. Thanks! 

## 2021-02-26 ENCOUNTER — Other Ambulatory Visit: Payer: Self-pay

## 2021-02-26 MED ORDER — LISINOPRIL 10 MG PO TABS: 10.0000 mg | ORAL_TABLET | Freq: Every day | ORAL | 0 refills | Status: DC

## 2021-02-26 MED ORDER — FLECAINIDE ACETATE 50 MG PO TABS: 50.0000 mg | ORAL_TABLET | Freq: Two times a day (BID) | ORAL | 0 refills | Status: DC

## 2021-02-26 NOTE — Telephone Encounter (Signed)
36m, lovw/cleaver 10/07/19(OVERDUE), scr 1.11 08/22/20, 137.3kg.

## 2021-03-04 ENCOUNTER — Other Ambulatory Visit: Payer: Self-pay | Admitting: General Practice

## 2021-03-06 ENCOUNTER — Other Ambulatory Visit: Payer: Self-pay | Admitting: General Practice

## 2021-03-07 ENCOUNTER — Telehealth (HOSPITAL_BASED_OUTPATIENT_CLINIC_OR_DEPARTMENT_OTHER): Payer: Self-pay | Admitting: Cardiovascular Disease

## 2021-03-07 MED ORDER — DILTIAZEM HCL ER COATED BEADS 240 MG PO CP24: 240.0000 mg | ORAL_CAPSULE | Freq: Every day | ORAL | 0 refills | Status: DC

## 2021-03-07 NOTE — Telephone Encounter (Signed)
*  STAT* If patient is at the pharmacy, call can be transferred to refill team.   1. Which medications need to be refilled? (please list name of each medication and dose if known) diltiazem (CARDIZEM CD) 240 MG 24 hr capsule  2. Which pharmacy/location (including street and city if local pharmacy) is medication to be sent to? [facility] [address] [place] - [address]  3. Do they need a 30 day or 90 day supply? 90

## 2021-03-07 NOTE — Telephone Encounter (Signed)
Rx request sent to pharmacy. Has appt with Dr. Duke Salvia 04/19/21.

## 2021-03-19 ENCOUNTER — Telehealth: Payer: Self-pay | Admitting: Cardiovascular Disease

## 2021-03-19 NOTE — Telephone Encounter (Signed)
Patient was calling to talk with Dr. Duke Salvia or nurse.Marland KitchenMarland KitchenMarland KitchenMarland Kitchenplease call back

## 2021-03-19 NOTE — Telephone Encounter (Signed)
Pt stated he wanted to confirm appt thought he had appt in August Reviewed appts and appears pt no showed for June appt and this was rescheduled for September Pt verbalized understanding ./cy

## 2021-04-19 ENCOUNTER — Other Ambulatory Visit: Payer: Self-pay

## 2021-04-19 ENCOUNTER — Encounter (HOSPITAL_BASED_OUTPATIENT_CLINIC_OR_DEPARTMENT_OTHER): Payer: Self-pay | Admitting: Cardiovascular Disease

## 2021-04-19 ENCOUNTER — Ambulatory Visit (INDEPENDENT_AMBULATORY_CARE_PROVIDER_SITE_OTHER): Payer: Medicaid Other | Admitting: Cardiovascular Disease

## 2021-04-19 VITALS — BP 148/114 | HR 77 | Ht 77.0 in | Wt 290.6 lb

## 2021-04-19 DIAGNOSIS — Z5181 Encounter for therapeutic drug level monitoring: Secondary | ICD-10-CM

## 2021-04-19 DIAGNOSIS — Z9119 Patient's noncompliance with other medical treatment and regimen: Secondary | ICD-10-CM

## 2021-04-19 DIAGNOSIS — I10 Essential (primary) hypertension: Secondary | ICD-10-CM

## 2021-04-19 DIAGNOSIS — I480 Paroxysmal atrial fibrillation: Secondary | ICD-10-CM | POA: Diagnosis not present

## 2021-04-19 DIAGNOSIS — Z91199 Patient's noncompliance with other medical treatment and regimen due to unspecified reason: Secondary | ICD-10-CM

## 2021-04-19 HISTORY — DX: Patient's noncompliance with other medical treatment and regimen due to unspecified reason: Z91.199

## 2021-04-19 HISTORY — DX: Patient's noncompliance with other medical treatment and regimen: Z91.19

## 2021-04-19 MED ORDER — SPIRONOLACTONE 25 MG PO TABS: 25.0000 mg | ORAL_TABLET | Freq: Every day | ORAL | 3 refills | Status: DC

## 2021-04-19 NOTE — Progress Notes (Signed)
Cardiology Office Note   Date:  04/19/2021   ID:  Christian Schroeder, DOB 04-15-1981, MRN [id number]  PCP:  Patient, No Pcp Per (Inactive)  Cardiologist:   Chilton Si, MD   No chief complaint on file.    History of Present Illness: Christian Schroeder is a 40 y.o. male with asthma and paroxysmal atrial fibrillation here for follow up.  He has a history of atrial fibrillation and underwent TEE/DCCV at age 44.  He subsequently underwent ablation while living in [place].  He was awakened from sleep 04/2017 with recurrent atrial fibrillation.  He was seen in the ED and his heart rate was 193 bpm.  When in atrial fibrillation he felt like his heart was racing and it was difficult to breathe.  He underwent DCCV and followed up in atrial fibrillation clinic 04/2017.  He was started on diltiazem and given a 30-day prescription of Eliquis.  He had an echo 04/2017 that revealed LVEF 55-60% and mildly dilated left atrium.  He was also referred for sleep study but has not yet had it.  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  He cancelled the sleep study 3 times due to work obligations.  [name] quit smoking. His BP at home had mostly been around 140 systolic. At his last appointment his blood pressure was elevated, but he wanted to work on diet and exercise before medication. For the last few months he has been depressed from several family deaths, including his father. He does not have anyone in terms of talking for support. At least twice a week he tries to monitor his blood pressure at home, and he notices a range of 130-150/90. Last year he had one episode of Afib. He went to the ED at that time. 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 Also, he is unsure of how he should be exercising. When he does exercise, he will do some weight lifting and cardio. While working he denies any exertional symptoms aside from  fatigue and diaphoresis. He has plans to increase his exercise when he begins a more stable corporate job. For his diet, he usually orders out if he is working outside of [place], which has been typical for the last two months. At the end of the day he has noticed that his socks sometimes leave an imprint on his lower extremities, and he is concerned about possible congestive heart failure. Lately he has been cutting his diltiazem in half. He endorses seasonal allergies, but he does not use any OTC medication. He denies any palpitations, chest pain, or shortness of breath. No lightheadedness, headaches, syncope, orthopnea, or PND.    Past Medical History:  Diagnosis Date   Asthma    Atrial fibrillation (HCC)    Essential hypertension 09/26/2017   Hypertension    Noncompliance 04/19/2021    Past Surgical History:  Procedure Laterality Date   CARDIAC SURGERY     cardioversion     Current Outpatient Medications  Medication Sig Dispense Refill   apixaban (ELIQUIS) 5 MG TABS tablet Take 1 tablet (5 mg total) by mouth 2 (two) times daily. APPOINTMENT NEEDED FOR FURTHER REFILLS 180 tablet 0   diltiazem (CARDIZEM LA) 120 MG 24 hr tablet Take 120 mg by mouth daily.     flecainide (TAMBOCOR) 50 MG tablet Take 1 tablet (50 mg total) by mouth 2 (two) times daily.  PATIENT MUST ATTEND UPCOMING APPOINTMENT FOR FUTURE REFILLS 120 tablet 0   spironolactone (ALDACTONE) 25 MG tablet Take 1 tablet (25 mg total) by mouth daily. 90 tablet 3   No current facility-administered medications for this visit.    Allergies:   Latex    Social History:  The patient  reports that he has been smoking cigarettes. He has been smoking an average of 1 pack per day. He uses smokeless tobacco. He reports that he does not currently use alcohol. He reports that he does not currently use drugs after having used the following drugs: Marijuana.   Family History:  The patient's family history includes Hypertension in his father  and mother; Kidney disease in his brother and mother.    ROS:   Please see the history of present illness. (+) Depression (+) Stress (+) Fatigue (+) Diaphoresis (+) Seasonal allergies All other systems are reviewed and negative.    PHYSICAL EXAM: VS:  BP (!) 148/114   Pulse 77   Ht 6\' 5"  (1.956 m)   Wt 290 lb 9.6 oz (131.8 kg)   BMI 34.46 kg/m  , BMI Body mass index is 34.46 kg/m. GENERAL:  Well appearing.  No acute distress. HEENT:  Pupils equal round and reactive, fundi not visualized, oral mucosa unremarkable NECK:  No jugular venous distention, waveform within normal limits, carotid upstroke brisk and symmetric, no bruits, no thyromegaly LUNGS:  Clear to auscultation bilaterally HEART:  RRR.  PMI not displaced or sustained,S1 and S2 within normal limits, no S3, no S4, no clicks, no rubs, no murmurs ABD:  Flat, positive bowel sounds normal in frequency in pitch, no bruits, no rebound, no guarding, no midline pulsatile mass, no hepatomegaly, no splenomegaly EXT:  2 plus pulses throughout, no edema, no cyanosis no clubbing SKIN:  No rashes no nodules NEURO:  Cranial nerves II through XII grossly intact, motor grossly intact throughout PSYCH:  Cognitively intact, oriented to person place and time   EKG:   04/19/2021: Sinus rhythm. Rate 77 bpm. LVH. 09/24/17: sinus rhythm.  Rate 69 bpm.  LVH with secondary repolarization abnormality  TEE 09/29/2019 (CE): Conclusions:                There is normal left ventricular systolic function.  The estimated  ejection fraction is 50-55%.  There is no thrombus visualized in the left atrial appendage.  There is a trace of aortic regurgitation.  There is a trace of mitral regurgitation.   CT Angio Neck 03/22/2018: IMPRESSION: 1. No acute intracranial process or abnormal enhancement of the brain identified. Unremarkable CT of the head. 2. Patent carotid and vertebral arteries. No dissection, aneurysm, or hemodynamically significant  stenosis utilizing NASCET criteria. 3. Patent anterior and posterior intracranial circulation. No large vessel occlusion, aneurysm, or focal high-grade stenosis.  Echo 05/09/17: Study Conclusions   - Left ventricle: GLSS is normal at -20% The cavity size was   normal. There was moderate concentric hypertrophy. Systolic   function was normal. The estimated ejection fraction was in the   range of 55% to 60%. Wall motion was normal; there were no   regional wall motion abnormalities. Left ventricular diastolic   function parameters were normal. - Left atrium: The atrium was mildly dilated  Recent Labs: 08/22/2020: BUN 12; Creatinine, Ser 1.11; Hemoglobin 15.1; Platelets 255; Potassium 3.0; Sodium 137    Lipid Panel No results found for: CHOL, TRIG, HDL, CHOLHDL, VLDL, LDLCALC, LDLDIRECT    Wt Readings from Last 3 Encounters:  04/19/21 290 lb 9.6 oz (131.8 kg)  08/22/20 280 lb (127 kg)  01/11/20 285 lb (129.3 kg)      ASSESSMENT AND PLAN: Essential hypertension Blood pressure is poorly controlled.  He self discontinued his hydrochlorothiazide.  He only takes 5 mg of the prescribed lisinopril.  He has had hypokalemia on multiple blood draws.  We will switch him to spironolactone 25 mg daily.  Check a basic metabolic panel and magnesium in a week.  He is moving so he will follow-up as needed.  We did discuss limiting sodium intake and trying to meal prep so that he can bring healthier meals with him instead of eating on the road.  We did offer the PREP program through the [facility], however he will be moving soon and unable to participate.  Paroxysmal atrial fibrillation (HCC) Atrial fibrillation has been well-controlled.  Continue diltiazem, Eliquis, and flecainide.  We will check a basic metabolic panel and a magnesium.  Noncompliance Mr. Schear is very hesitant about taking medications.    Time spent: 45 minutes-Greater than 50% of this time was spent in counseling, explanation of  diagnosis, planning of further management, and coordination of care.     Current medicines are reviewed at length with the patient today.  The patient does not have concerns regarding medicines.  The following changes have been made:  no change  Labs/ tests ordered today include:   Orders Placed This Encounter  Procedures   CBC with Differential/Platelet   Magnesium   Basic metabolic panel   EKG 12-Lead      Disposition:   FU with Dawit Tankard C. Duke Salvia, MD, [facility] as needed.     I,Mathew Stumpf,acting as a Neurosurgeon for Chilton Si, MD.,have documented all relevant documentation on the behalf of Chilton Si, MD,as directed by  Chilton Si, MD while in the presence of Chilton Si, MD.  I, Miguel Christiana C. Duke Salvia, MD have reviewed all documentation for this visit.  The documentation of the exam, diagnosis, procedures, and orders on 04/19/2021 are all accurate and complete.   Signed, Lou Loewe C. Duke Salvia, MD, [facility]  04/19/2021 7:44 PM    [facility]

## 2021-04-19 NOTE — Patient Instructions (Signed)
Medication Instructions:  START SPIRONOLACTONE 25 MG DAILY   Labwork: CBC/BMET/MAGNESIUM IN 1 WEEK   Testing/Procedures: NONE  Follow-Up: AS NEEDED

## 2021-04-19 NOTE — Assessment & Plan Note (Signed)
Mr. Foglio is very hesitant about taking medications.

## 2021-04-19 NOTE — Assessment & Plan Note (Signed)
Atrial fibrillation has been well-controlled.  Continue diltiazem, Eliquis, and flecainide.  We will check a basic metabolic panel and a magnesium.

## 2021-04-19 NOTE — Assessment & Plan Note (Addendum)
Blood pressure is poorly controlled.  He self discontinued his hydrochlorothiazide.  He only takes 5 mg of the prescribed lisinopril.  He has had hypokalemia on multiple blood draws.  We will switch him to spironolactone 25 mg daily.  Check a basic metabolic panel and magnesium in a week.  He is moving so he will follow-up as needed.  We did discuss limiting sodium intake and trying to meal prep so that he can bring healthier meals with him instead of eating on the road.  We did offer the PREP program through the [facility], however he will be moving soon and unable to participate.

## 2021-05-10 ENCOUNTER — Telehealth (HOSPITAL_BASED_OUTPATIENT_CLINIC_OR_DEPARTMENT_OTHER): Payer: Self-pay | Admitting: Cardiovascular Disease

## 2021-05-10 ENCOUNTER — Encounter (HOSPITAL_BASED_OUTPATIENT_CLINIC_OR_DEPARTMENT_OTHER): Payer: Self-pay

## 2021-05-10 NOTE — Telephone Encounter (Signed)
Pt c/o medication issue:  1. Name of Medication: spironolactone (ALDACTONE) 25 MG tablet  2. How are you currently taking this medication (dosage and times per day)?   3. Are you having a reaction (difficulty breathing--STAT)?   4. What is your medication issue? Pharmacy was calling for clarification regarding dispensing instructions for this medication

## 2021-05-17 NOTE — Telephone Encounter (Signed)
Late entry  Spoke with pharmacy and advised patient d/c Lisinopril  Did try to call patient to confirm and remind of needed lab work  Number have are not working  [employer] to follow up and patient transferred Rx to [phone] Spoke with that pharmacy and patient has no Lisinopril on file and has not picked up Spironolactone

## 2021-06-19 ENCOUNTER — Telehealth: Payer: Self-pay | Admitting: Cardiovascular Disease

## 2021-06-19 MED ORDER — DILTIAZEM HCL ER COATED BEADS 120 MG PO TB24: 120.0000 mg | ORAL_TABLET | Freq: Every day | ORAL | 1 refills | Status: DC

## 2021-06-19 NOTE — Telephone Encounter (Signed)
Rx request sent to pharmacy.  

## 2021-06-19 NOTE — Telephone Encounter (Signed)
*  STAT* If patient is at the pharmacy, call can be transferred to refill team.   1. Which medications need to be refilled? (please list name of each medication and dose if known)  diltiazem (CARDIZEM LA) 120 MG 24 hr tablet  2. Which pharmacy/location (including street and city if local pharmacy) is medication to be sent to? [facility] - [place], [place] - [address]  3. Do they need a 30 day or 90 day supply? 90 day supply    Patient has been out for a few days.

## 2021-06-22 ENCOUNTER — Other Ambulatory Visit (HOSPITAL_BASED_OUTPATIENT_CLINIC_OR_DEPARTMENT_OTHER): Payer: Self-pay | Admitting: Cardiovascular Disease

## 2021-06-29 NOTE — Telephone Encounter (Signed)
Have tried multiple times to reach patient  At last visit patient stated he was moving

## 2021-07-20 IMAGING — DX DG CHEST 1V PORT
1 series · 1 of 1 positions shown · non-contrast
Comparison: 04/29/2017

CLINICAL DATA: Chest pain, arrhythmia, lightheadedness

EXAM:
PORTABLE CHEST 1 VIEW

[chest ap]
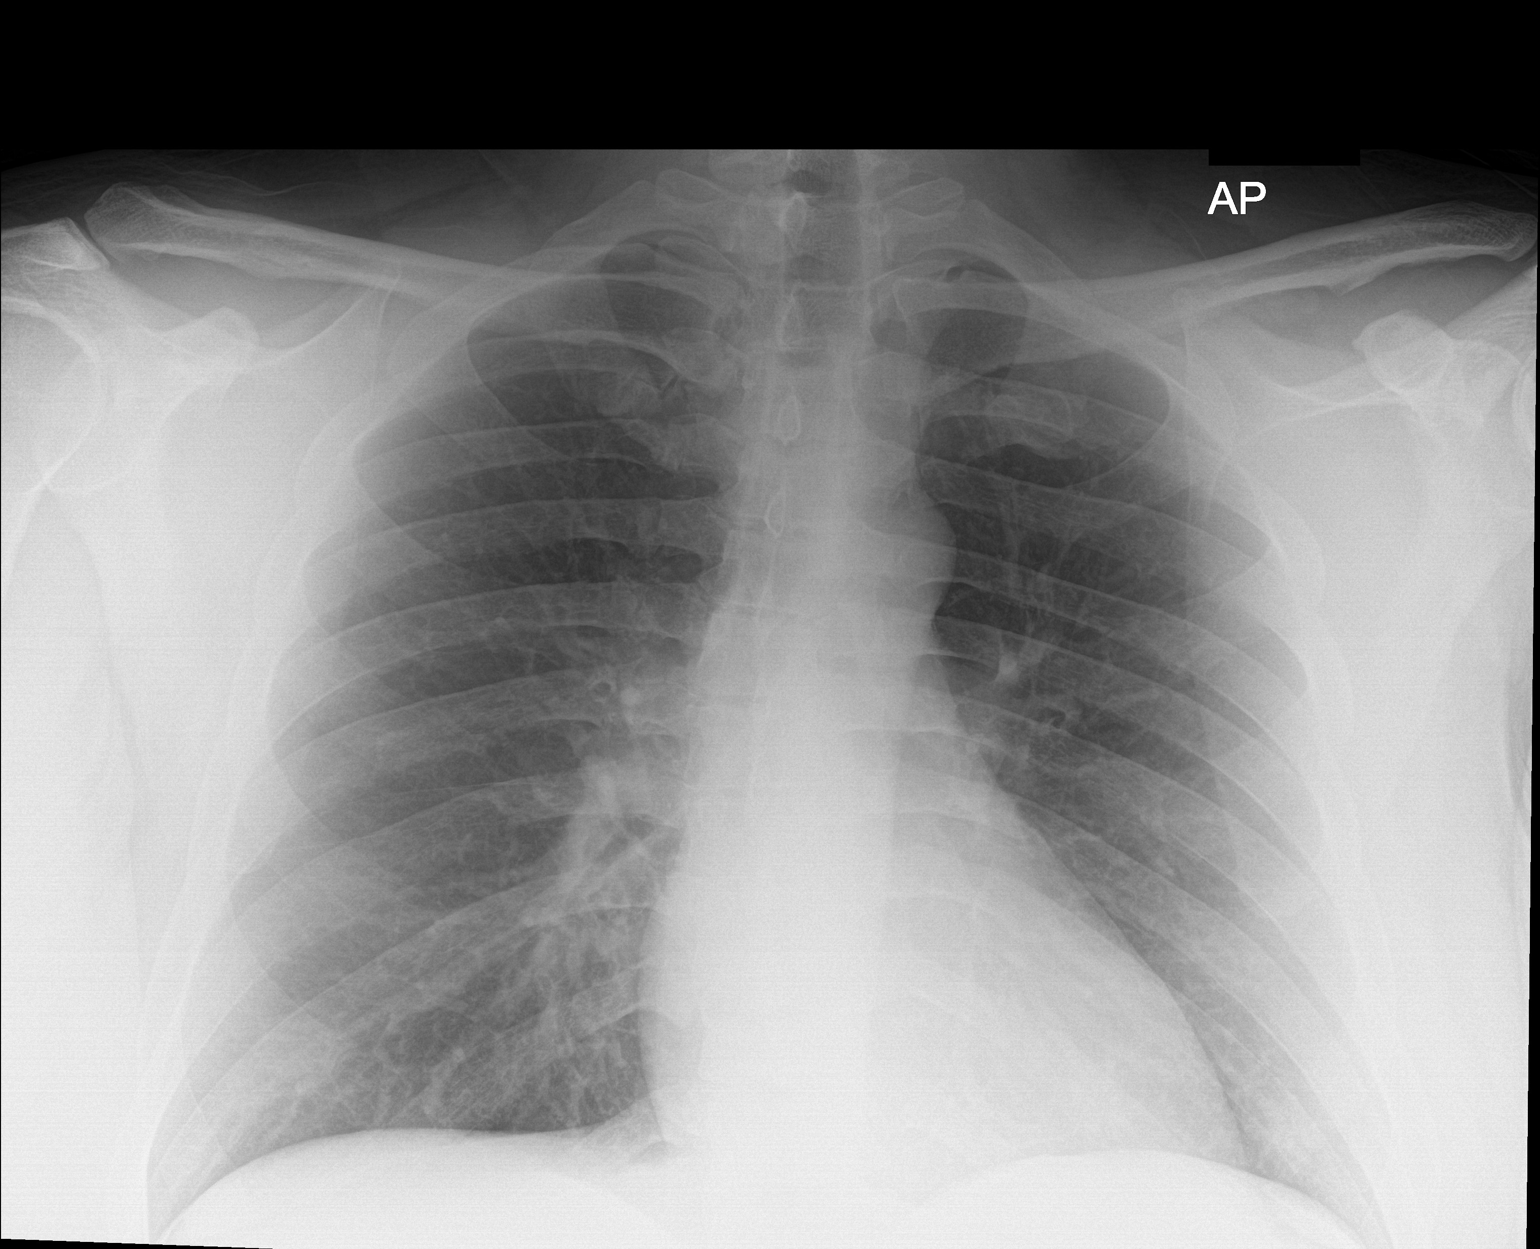

[1 of 1 positions shown; findings below may reference images not displayed]

FINDINGS: The heart size and mediastinal contours are within normal limits.
Both lungs are clear. The visualized skeletal structures are
unremarkable.
IMPRESSION: No active disease.

## 2021-08-03 ENCOUNTER — Telehealth: Payer: Self-pay | Admitting: Cardiovascular Disease

## 2021-08-03 ENCOUNTER — Other Ambulatory Visit: Payer: Self-pay | Admitting: Cardiovascular Disease

## 2021-08-03 NOTE — Telephone Encounter (Signed)
Patient called to talk with Dr. Duke Salvia or nurse in regards to his medication. Patient wanted ask some questions.

## 2021-08-03 NOTE — Telephone Encounter (Signed)
Spoke with patient regarding medication that was refilled by refill team  Did remind patient he needed follow up lab that was recommended at last office visit, stated he didn't know about lab work  Explained that we discussed at his visit and reminded him that he even needed to get if he moved Plus it was on AVS, highlighted and reviewed at visit  Confirmed medication changes made as recommended by Dr Duke Salvia  Patient stated he would go next week for labs

## 2021-08-07 LAB — CBC WITH DIFFERENTIAL/PLATELET
Basophils Absolute: 0 10*3/uL (ref 0.0–0.2)
Basos: 0 %
EOS (ABSOLUTE): 0.2 10*3/uL (ref 0.0–0.4)
Eos: 2 %
Hematocrit: 44.7 % (ref 37.5–51.0)
Hemoglobin: 14.7 g/dL (ref 13.0–17.7)
Immature Grans (Abs): 0 10*3/uL (ref 0.0–0.1)
Immature Granulocytes: 0 %
Lymphocytes Absolute: 3.1 10*3/uL (ref 0.7–3.1)
Lymphs: 45 %
MCH: 27.5 pg (ref 26.6–33.0)
MCHC: 32.9 g/dL (ref 31.5–35.7)
MCV: 84 fL (ref 79–97)
Monocytes Absolute: 0.7 10*3/uL (ref 0.1–0.9)
Monocytes: 10 %
Neutrophils Absolute: 3 10*3/uL (ref 1.4–7.0)
Neutrophils: 43 %
Platelets: 324 10*3/uL (ref 150–450)
RBC: 5.34 x10E6/uL (ref 4.14–5.80)
RDW: 14.1 % (ref 11.6–15.4)
WBC: 6.9 10*3/uL (ref 3.4–10.8)

## 2021-08-07 LAB — BASIC METABOLIC PANEL
BUN/Creatinine Ratio: 12 (ref 9–20)
BUN: 12 mg/dL (ref 6–20)
CO2: 25 mmol/L (ref 20–29)
Calcium: 10.1 mg/dL (ref 8.7–10.2)
Chloride: 102 mmol/L (ref 96–106)
Creatinine, Ser: 1.02 mg/dL (ref 0.76–1.27)
Glucose: 105 mg/dL — ABNORMAL HIGH (ref 70–99)
Potassium: 4.2 mmol/L (ref 3.5–5.2)
Sodium: 139 mmol/L (ref 134–144)
eGFR: 96 mL/min/{1.73_m2} (ref >59)

## 2021-08-07 LAB — MAGNESIUM: Magnesium: 2 mg/dL (ref 1.6–2.3)

## 2021-08-23 ENCOUNTER — Other Ambulatory Visit: Payer: Self-pay | Admitting: General Practice

## 2021-08-24 NOTE — Telephone Encounter (Signed)
Prescription refill request for Eliquis received. Indication:Afib Last office visit:9/22 Scr:1.0 Age: 41 Weight:131.8 kg  Prescription refilled

## 2021-09-10 ENCOUNTER — Telehealth (HOSPITAL_BASED_OUTPATIENT_CLINIC_OR_DEPARTMENT_OTHER): Payer: Self-pay | Admitting: Cardiovascular Disease

## 2021-09-10 NOTE — Telephone Encounter (Signed)
Patient wanted to know what medicine he can take for his cold symptoms. He has a cough and some congestion.  He wants to make sure what he takes won't interact with his heart medication

## 2021-09-10 NOTE — Telephone Encounter (Signed)
Advised patient on appropriate medication to take for cold management with HBP

## 2021-12-14 ENCOUNTER — Other Ambulatory Visit (HOSPITAL_BASED_OUTPATIENT_CLINIC_OR_DEPARTMENT_OTHER): Payer: Self-pay | Admitting: Cardiovascular Disease

## 2021-12-14 NOTE — Telephone Encounter (Signed)
Rx(s) sent to pharmacy electronically.  

## 2022-03-04 ENCOUNTER — Telehealth: Payer: Self-pay | Admitting: Cardiovascular Disease

## 2022-03-04 NOTE — Telephone Encounter (Signed)
Pt states that he would like for nurse to callback regarding a question that he has regarding a medication. Please advise

## 2022-03-04 NOTE — Telephone Encounter (Signed)
RN returned call to patient,   Patient states his wife recently went through a situation and is now breastfeeding, patient is wanting to know if there is an alternative to Plavix that she can take while breastfeeding. Informed patient that we cannot advise on medications for another patient. Advised him to have his wife speak with her doctor in regards to recommendations.

## 2022-03-27 ENCOUNTER — Other Ambulatory Visit (HOSPITAL_BASED_OUTPATIENT_CLINIC_OR_DEPARTMENT_OTHER): Payer: Self-pay | Admitting: Cardiovascular Disease

## 2022-03-27 NOTE — Telephone Encounter (Signed)
Rx request sent to pharmacy.  

## 2022-04-23 NOTE — Progress Notes (Signed)
Cardiology Office Note:    Date:  04/24/2022   ID:  Christian Schroeder, DOB 1981/04/14, MRN [id number]  PCP:  Patient, No Pcp Per   [facility] Providers Cardiologist:  Chilton Si, MD     Referring MD: No ref. provider found   No chief complaint on file.   History of Present Illness:    Christian Schroeder is a 41 y.o. male with a hx of asthma and paroxysmal atrial fibrillation here for follow up.  He has a history of atrial fibrillation and underwent TEE/DCCV at age 60.  He subsequently underwent ablation while living in [place].  He was awakened from sleep 04/2017 with recurrent atrial fibrillation.  He was seen in the ED and his heart rate was 193 bpm.  When in atrial fibrillation he felt like his heart was racing and it was difficult to breathe.  He underwent DCCV and followed up in atrial fibrillation clinic 04/2017.  He was started on diltiazem and given a 30-day prescription of Eliquis.  He had an echo 04/2017 that revealed LVEF 55-60% and mildly dilated left atrium.  He was also referred for sleep study but has not yet had it.  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  He cancelled the sleep study 3 times due to work obligations.   Christian Schroeder quit smoking. His BP at home had mostly been around 140 systolic. He wanted to work on diet and exercise before medication. At his last appointment he was struggling with depression and had lost several family members including his father. His BP was ranging 130-150/90 at home. He reported one episode of Afib in the prior year which prompted an ED visit. He noted that he had been cutting his diltiazem in half. Lisinopril was switched to spironolactone 25 mg daily.   Today, he states he is feeling okay. It has been about a year since his last known episode of atrial fibrillation. At home his systolic blood pressure has been in the high 130's to 140's. Diastolic pressure is usually in the 80's. In clinic today his BP is 158/116 (148/102  on recheck); he took his antihypertensives about 15-20 minutes ago. Lately he has not been able to be as active as he would like as he is caring for his 12 month old child. He works in [employer], which may be 3-4 hours of intense physical work most days of the week. For exercise he also rides a bicycle 20 minutes a day. He states his diet has been "decent, but different". While working he will frequently order out. In the past week he has worked on dietary changes including oatmeal for breakfast and spinach and tuna for dinner. Recently he has been very tired. He also endorses snoring. He denies any palpitations, chest pain, shortness of breath, or peripheral edema. No lightheadedness, headaches, syncope, orthopnea, or PND.   Past Medical History:  Diagnosis Date   Asthma    Atrial fibrillation (HCC)    Essential hypertension 09/26/2017   Hypertension    Noncompliance 04/19/2021   Obesity (BMI 30-39.9) 04/24/2022   Snoring 04/24/2022    Past Surgical History:  Procedure Laterality Date   CARDIAC SURGERY     cardioversion    Current Medications: Current Meds  Medication Sig   apixaban (ELIQUIS) 5 MG TABS tablet TAKE 1 TABLET BY MOUTH TWICE DAILY . APPOINTMENT REQUIRED FOR FUTURE REFILLS   diltiazem (CARDIZEM CD) 120 MG 24 hr capsule TAKE 1 CAPSULE BY MOUTH  EVERY DAY   flecainide (TAMBOCOR) 50 MG tablet Take 1 tablet (50 mg total) by mouth 2 (two) times daily.   spironolactone (ALDACTONE) 25 MG tablet Take 1 tablet by mouth once daily     Allergies:   Latex   Social History   Socioeconomic History   Marital status: Single    Spouse name: Not on file   Number of children: Not on file   Years of education: Not on file   Highest education level: Not on file  Occupational History   Not on file  Tobacco Use   Smoking status: Light Smoker    Packs/day: 1.00    Types: Cigarettes    Last attempt to quit: 05/30/2018    Years since quitting: 3.9   Smokeless tobacco: Current   Tobacco  comments:    2 cigerettes a day  Vaping Use   Vaping Use: Never used  Substance and Sexual Activity   Alcohol use: Not Currently    Comment: occ   Drug use: Not Currently    Types: Marijuana   Sexual activity: Not on file  Other Topics Concern   Not on file  Social History Narrative   Not on file   Social Determinants of Health   Financial Resource Strain: Not on file  Food Insecurity: Not on file  Transportation Needs: Not on file  Physical Activity: Not on file  Stress: Not on file  Social Connections: Not on file     Family History: The patient's family history includes Hypertension in his father and mother; Kidney disease in his brother and mother.  ROS:   Please see the history of present illness.    (+) Fatigue (+) Snoring All other systems reviewed and are negative.  EKGs/Labs/Other Studies Reviewed:    The following studies were reviewed today:  TEE 09/29/2019 (CE): Conclusions:                There is normal left ventricular systolic function.  The estimated  ejection fraction is 50-55%.  There is no thrombus visualized in the left atrial appendage.  There is a trace of aortic regurgitation.  There is a trace of mitral regurgitation.    CT Angio Neck 03/22/2018: IMPRESSION: 1. No acute intracranial process or abnormal enhancement of the brain identified. Unremarkable CT of the head. 2. Patent carotid and vertebral arteries. No dissection, aneurysm, or hemodynamically significant stenosis utilizing NASCET criteria. 3. Patent anterior and posterior intracranial circulation. No large vessel occlusion, aneurysm, or focal high-grade stenosis.   Echo 05/09/17: Study Conclusions   - Left ventricle: GLSS is normal at -20% The cavity size was   normal. There was moderate concentric hypertrophy. Systolic   function was normal. The estimated ejection fraction was in the   range of 55% to 60%. Wall motion was normal; there were no   regional wall motion  abnormalities. Left ventricular diastolic   function parameters were normal. - Left atrium: The atrium was mildly dilated   EKG:   EKG is personally reviewed. 04/24/2022: Sinus rhythm. Rate 77 bpm.  04/19/2021: Sinus rhythm. Rate 77 bpm. LVH. 09/24/17: sinus rhythm.  Rate 69 bpm.  LVH with secondary repolarization abnormality  Recent Labs: 08/06/2021: BUN 12; Creatinine, Ser 1.02; Hemoglobin 14.7; Magnesium 2.0; Platelets 324; Potassium 4.2; Sodium 139   Recent Lipid Panel No results found for: "CHOL", "TRIG", "HDL", "CHOLHDL", "VLDL", "LDLCALC", "LDLDIRECT"   Risk Assessment/Calculations:    CHA2DS2-VASc Score = 1   This indicates a 0.6% annual  risk of stroke. The patient's score is based upon: CHF History: 0 HTN History: 1 Diabetes History: 0 Stroke History: 0 Vascular Disease History: 0 Age Score: 0 Gender Score: 0      STOP-Bang Score:  6       Physical Exam:    Wt Readings from Last 3 Encounters:  04/24/22 (!) 320 lb 12.8 oz (145.5 kg)  04/19/21 290 lb 9.6 oz (131.8 kg)  08/22/20 280 lb (127 kg)     VS:  BP (!) 148/102 (BP Location: Right Arm, Patient Position: Sitting, Cuff Size: Large)   Pulse 77   Ht 6\' 5"  (1.956 m)   Wt (!) 320 lb 12.8 oz (145.5 kg)   BMI 38.04 kg/m  , BMI Body mass index is 38.04 kg/m. GENERAL:  Well appearing HEENT: Pupils equal round and reactive, fundi not visualized, oral mucosa unremarkable NECK:  No jugular venous distention, waveform within normal limits, carotid upstroke brisk and symmetric, no bruits, no thyromegaly LUNGS:  Clear to auscultation bilaterally HEART:  RRR.  PMI not displaced or sustained,S1 and S2 within normal limits, no S3, no S4, no clicks, no rubs, no murmurs ABD:  Flat, positive bowel sounds normal in frequency in pitch, no bruits, no rebound, no guarding, no midline pulsatile mass, no hepatomegaly, no splenomegaly EXT:  2 plus pulses throughout, no edema, no cyanosis no clubbing SKIN:  No rashes no  nodules NEURO:  Cranial nerves II through XII grossly intact, motor grossly intact throughout PSYCH:  Cognitively intact, oriented to person place and time   ASSESSMENT:    1. Essential hypertension   2. Paroxysmal atrial fibrillation (HCC)   3. Therapeutic drug monitoring   4. Snoring   5. Daytime somnolence   6. Obesity (BMI 30-39.9)    PLAN:    Paroxysmal atrial fibrillation (HCC) No recent episodes of atrial fibrillation.  He is doing well on diltiazem, flecainide and Eliquis.  Check CMP and CBC today.  We will also check a magnesium given that he is on flecainide.  Essential hypertension Blood pressure is poorly controlled.  Recommended adding an additional agent.  However he prefers to work on diet and exercise.  For now, continue diltiazem and spironolactone.  He will work on increasing his exercise to at least 150 minutes weekly.  He will work on meal prep.  We will also refer him for a sleep study as he likely has sleep apnea which is contributing to his hypertension.  Obesity (BMI 30-39.9) Yesterday has gained 30 pounds in the last year.  He does have a newborn which makes it more difficult for him to exercise.  Strategized on ways to find time for exercise and his life.  We also discussed the importance of changing his diet.  Check globin A1c and lipids.  Snoring Patient reports snoring and daytime somnolence.  He does not feel well rested in the mornings.  His mother has sleep apnea.  He also has atrial fibrillation.  Check a sleep study.   Disposition: FU with , NP in 2 months. FU with Eon Zunker C. Gillian Shields, MD, [facility] in 1 year.  Medication Adjustments/Labs and Tests Ordered: Current medicines are reviewed at length with the patient today.  Concerns regarding medicines are outlined above.   Orders Placed This Encounter  Procedures   CBC with Differential/Platelet   Comprehensive metabolic panel   Lipid panel   Magnesium   Hemoglobin A1c   EKG 12-Lead    Split night study   No  orders of the defined types were placed in this encounter.  Patient Instructions  Medication Instructions:  Your physician recommends that you continue on your current medications as directed. Please refer to the Current Medication list given to you today.  *If you need a refill on your cardiac medications before your next appointment, please call your pharmacy*   Lab Work: FASTING LP/CMET/CBC/MAGNESIUM SOON   If you have labs (blood work) drawn today and your tests are completely normal, you will receive your results only by: MyChart Message (if you have MyChart) OR A paper copy in the mail If you have any lab test that is abnormal or we need to change your treatment, we will call you to review the results.   Testing/Procedures: Your physician has recommended that you have a sleep study. This test records several body functions during sleep, including: brain activity, eye movement, oxygen and carbon dioxide blood levels, heart rate and rhythm, breathing rate and rhythm, the flow of air through your mouth and nose, snoring, body muscle movements, and chest and belly movement. THE OFFICE WILL CALL YOU TO SCHEDULE ONCE YOUR INSURANCE HAS BEEN REVIEWED. IF YOU DO  NOT HEAR IN 2 WEEKS CALL THE OFFICE AT [phone] TO FOLLOW UP    Follow-Up: At [facility], you and your health needs are our priority.  As part of our continuing mission to provide you with exceptional heart care, we have created designated Provider Care Teams.  These Care Teams include your primary Cardiologist (physician) and Advanced Practice Providers (APPs -  Physician Assistants and Nurse Practitioners) who all work together to provide you with the care you need, when you need it.  We recommend signing up for the patient portal called "MyChart".  Sign up information is provided on this After Visit Summary.  MyChart is used to connect with patients for Virtual Visits (Telemedicine).   Patients are able to view lab/test results, encounter notes, upcoming appointments, etc.  Non-urgent messages can be sent to your provider as well.   To learn more about what you can do with MyChart, go to [email or web address].    Your next appointment:   2 month(s)  The format for your next appointment:   In Person  Provider:   Gillian Shields, NP    DR [facility] IN 6 MONTHS  Other Instructions  Exercise recommendations: The American Heart Association recommends 150 minutes of moderate intensity exercise weekly. Try 30 minutes of moderate intensity exercise 4-5 times per week. This could include walking, jogging, or swimming.  MONITOR YOUR BLOOD PRESSURE DAILY AND LOG. BRING READINGS AND MACHINE TO FOLLOW UP IN 2 MONTHS           I,Mathew Stumpf,acting as a scribe for Chilton Si, MD.,have documented all relevant documentation on the behalf of Chilton Si, MD,as directed by  Chilton Si, MD while in the presence of Chilton Si, MD.  I, Kinslea Frances C. Duke Salvia, MD have reviewed all documentation for this visit.  The documentation of the exam, diagnosis, procedures, and orders on 04/24/2022 are all accurate and complete.   Signed, Chilton Si, MD  04/24/2022 12:44 PM    [facility]

## 2022-04-24 ENCOUNTER — Ambulatory Visit (INDEPENDENT_AMBULATORY_CARE_PROVIDER_SITE_OTHER): Payer: Medicaid Other | Admitting: Cardiovascular Disease

## 2022-04-24 ENCOUNTER — Encounter (HOSPITAL_BASED_OUTPATIENT_CLINIC_OR_DEPARTMENT_OTHER): Payer: Self-pay | Admitting: Cardiovascular Disease

## 2022-04-24 VITALS — BP 148/102 | HR 77 | Ht 77.0 in | Wt 320.8 lb

## 2022-04-24 DIAGNOSIS — R0683 Snoring: Secondary | ICD-10-CM

## 2022-04-24 DIAGNOSIS — I10 Essential (primary) hypertension: Secondary | ICD-10-CM | POA: Diagnosis not present

## 2022-04-24 DIAGNOSIS — I480 Paroxysmal atrial fibrillation: Secondary | ICD-10-CM | POA: Diagnosis not present

## 2022-04-24 DIAGNOSIS — R400 Somnolence: Secondary | ICD-10-CM

## 2022-04-24 DIAGNOSIS — E669 Obesity, unspecified: Secondary | ICD-10-CM

## 2022-04-24 DIAGNOSIS — Z5181 Encounter for therapeutic drug level monitoring: Secondary | ICD-10-CM | POA: Diagnosis not present

## 2022-04-24 HISTORY — DX: Obesity, unspecified: E66.9

## 2022-04-24 HISTORY — DX: Snoring: R06.83

## 2022-04-24 NOTE — Assessment & Plan Note (Signed)
Patient reports snoring and daytime somnolence.  He does not feel well rested in the mornings.  His mother has sleep apnea.  He also has atrial fibrillation.  Check a sleep study.

## 2022-04-24 NOTE — Assessment & Plan Note (Addendum)
No recent episodes of atrial fibrillation.  He is doing well on diltiazem, flecainide and Eliquis.  Check CMP and CBC today.  We will also check a magnesium given that he is on flecainide.

## 2022-04-24 NOTE — Patient Instructions (Signed)
Medication Instructions:  Your physician recommends that you continue on your current medications as directed. Please refer to the Current Medication list given to you today.  *If you need a refill on your cardiac medications before your next appointment, please call your pharmacy*   Lab Work: FASTING LP/CMET/CBC/MAGNESIUM SOON   If you have labs (blood work) drawn today and your tests are completely normal, you will receive your results only by: MyChart Message (if you have MyChart) OR A paper copy in the mail If you have any lab test that is abnormal or we need to change your treatment, we will call you to review the results.   Testing/Procedures: Your physician has recommended that you have a sleep study. This test records several body functions during sleep, including: brain activity, eye movement, oxygen and carbon dioxide blood levels, heart rate and rhythm, breathing rate and rhythm, the flow of air through your mouth and nose, snoring, body muscle movements, and chest and belly movement. THE OFFICE WILL CALL YOU TO SCHEDULE ONCE YOUR INSURANCE HAS BEEN REVIEWED. IF YOU DO  NOT HEAR IN 2 WEEKS CALL THE OFFICE AT [phone] TO FOLLOW UP    Follow-Up: At [facility], you and your health needs are our priority.  As part of our continuing mission to provide you with exceptional heart care, we have created designated Provider Care Teams.  These Care Teams include your primary Cardiologist (physician) and Advanced Practice Providers (APPs -  Physician Assistants and Nurse Practitioners) who all work together to provide you with the care you need, when you need it.  We recommend signing up for the patient portal called "MyChart".  Sign up information is provided on this After Visit Summary.  MyChart is used to connect with patients for Virtual Visits (Telemedicine).  Patients are able to view lab/test results, encounter notes, upcoming appointments, etc.  Non-urgent messages can be  sent to your provider as well.   To learn more about what you can do with MyChart, go to [email or web address].    Your next appointment:   2 month(s)  The format for your next appointment:   In Person  Provider:   Gillian Shields, NP    DR [facility] IN 6 MONTHS  Other Instructions  Exercise recommendations: The American Heart Association recommends 150 minutes of moderate intensity exercise weekly. Try 30 minutes of moderate intensity exercise 4-5 times per week. This could include walking, jogging, or swimming.  MONITOR YOUR BLOOD PRESSURE DAILY AND LOG. BRING READINGS AND MACHINE TO FOLLOW UP IN 2 MONTHS

## 2022-04-24 NOTE — Assessment & Plan Note (Signed)
Blood pressure is poorly controlled.  Recommended adding an additional agent.  However he prefers to work on diet and exercise.  For now, continue diltiazem and spironolactone.  He will work on increasing his exercise to at least 150 minutes weekly.  He will work on meal prep.  We will also refer him for a sleep study as he likely has sleep apnea which is contributing to his hypertension.

## 2022-04-24 NOTE — Assessment & Plan Note (Signed)
Yesterday has gained 30 pounds in the last year.  He does have a newborn which makes it more difficult for him to exercise.  Strategized on ways to find time for exercise and his life.  We also discussed the importance of changing his diet.  Check globin A1c and lipids.

## 2022-04-25 ENCOUNTER — Telehealth: Payer: Self-pay | Admitting: *Deleted

## 2022-04-25 NOTE — Telephone Encounter (Signed)
Called and spoke with the patient asking him for the actual name and /or phone number on the back of his insurance card. I will need information in order to precert for the requested sleep study. Patient states that he is "out and about" right now and will call me tomorrow with this information.

## 2022-04-27 ENCOUNTER — Other Ambulatory Visit (HOSPITAL_BASED_OUTPATIENT_CLINIC_OR_DEPARTMENT_OTHER): Payer: Self-pay | Admitting: Cardiovascular Disease

## 2022-04-29 NOTE — Telephone Encounter (Signed)
Rx(s) sent to pharmacy electronically.  

## 2022-05-10 ENCOUNTER — Telehealth: Payer: Self-pay | Admitting: *Deleted

## 2022-05-10 NOTE — Telephone Encounter (Signed)
Spoke with patient again informing him that I need his insurance information in order to proceed with sleep study request. He states that he is "out and about right now and will call you back later with the information."

## 2022-05-12 ENCOUNTER — Other Ambulatory Visit: Payer: Self-pay | Admitting: General Practice

## 2022-05-13 NOTE — Telephone Encounter (Signed)
Prescription refill request for Eliquis received. Indication: afib  Last office visit: [name], 04/24/2022 Scr: 1.02, 08/06/2021 Age: 41 yo  Weight: 145.5 kg   Refill sent.

## 2022-05-16 ENCOUNTER — Telehealth: Payer: Self-pay | Admitting: *Deleted

## 2022-05-16 NOTE — Telephone Encounter (Signed)
Spoke with patient twice to get insurance information in order to precert for sleep study. Both times he stated that he was not home and would call me back with the information. He still has not called to provide me with the information needed. I will defer the order and wait for him to call back. Ordering provider will be notified.

## 2022-06-24 ENCOUNTER — Ambulatory Visit (HOSPITAL_BASED_OUTPATIENT_CLINIC_OR_DEPARTMENT_OTHER): Payer: Medicaid Other | Admitting: Family

## 2022-06-28 ENCOUNTER — Ambulatory Visit (HOSPITAL_BASED_OUTPATIENT_CLINIC_OR_DEPARTMENT_OTHER): Payer: Medicaid Other | Admitting: Family

## 2022-06-28 NOTE — Progress Notes (Incomplete)
   Office Visit    Patient Name: Christian Schroeder Date of Encounter: 06/28/2022  PCP:  Patient, No Pcp Per   [facility]  Cardiologist:  Chilton Si, MD *** Advanced Practice Provider:  No care team member to display Electrophysiologist:  None  {Press F2 to show EP APP, CHF, sleep or structural heart MD               :[id number]}  { Click here to update then REFRESH NOTE - MD (PCP) or APP (Team Member)  Change PCP Type for MD, Specialty for APP is either Cardiology or Clinical Cardiac Electrophysiology  :[id number]}  Chief Complaint    Christian Schroeder is a 41 y.o. male presents today for ***   Past Medical History    Past Medical History:  Diagnosis Date   Asthma    Atrial fibrillation (HCC)    Essential hypertension 09/26/2017   Hypertension    Noncompliance 04/19/2021   Obesity (BMI 30-39.9) 04/24/2022   Snoring 04/24/2022   Past Surgical History:  Procedure Laterality Date   CARDIAC SURGERY     cardioversion    Allergies  Allergies  Allergen Reactions   Latex Itching and Rash    History of Present Illness    Christian Schroeder is a 40 y.o. male with a hx of *** last seen ***.   EKGs/Labs/Other Studies Reviewed:   The following studies were reviewed today: ***  EKG:  EKG is *** ordered today.  The ekg ordered today demonstrates ***  Recent Labs: 08/06/2021: BUN 12; Creatinine, Ser 1.02; Hemoglobin 14.7; Magnesium 2.0; Platelets 324; Potassium 4.2; Sodium 139  Recent Lipid Panel No results found for: "CHOL", "TRIG", "HDL", "CHOLHDL", "VLDL", "LDLCALC", "LDLDIRECT"  Risk Assessment/Calculations:  {Does this patient have ATRIAL FIBRILLATION?:[phone]}  Home Medications   No outpatient medications have been marked as taking for the 06/28/22 encounter (Appointment) with Christian Sorrow, NP.     Review of Systems   ***   All other systems reviewed and are otherwise negative except as noted above.  Physical Exam    VS:  There were no  vitals taken for this visit. , BMI There is no height or weight on file to calculate BMI.  Wt Readings from Last 3 Encounters:  04/24/22 (!) 320 lb 12.8 oz (145.5 kg)  04/19/21 290 lb 9.6 oz (131.8 kg)  08/22/20 280 lb (127 kg)     GEN: Well nourished, well developed, in no acute distress. HEENT: normal. Neck: Supple, no JVD, carotid bruits, or masses. Cardiac: ***RRR, no murmurs, rubs, or gallops. No clubbing, cyanosis, edema.  ***Radials/PT 2+ and equal bilaterally.  Respiratory:  ***Respirations regular and unlabored, clear to auscultation bilaterally. GI: Soft, nontender, nondistended. MS: No deformity or atrophy. Skin: Warm and dry, no rash. Neuro:  Strength and sensation are intact. Psych: Normal affect.  Assessment & Plan    ***  No BP recorded.  {Refresh Note OR Click here to enter BP  :1}***      Disposition: Follow up {follow up:15908} with Chilton Si, MD or APP.  Signed, Christian Sorrow, NP 06/28/2022, 2:15 PM [facility]

## 2022-06-30 ENCOUNTER — Emergency Department (HOSPITAL_BASED_OUTPATIENT_CLINIC_OR_DEPARTMENT_OTHER)
Admission: EM | Admit: 2022-06-30 | Discharge: 2022-06-30 | Disposition: A | Discharge status: Home / Self Care | Payer: Medicaid Other | Attending: Emergency Medicine | Admitting: Emergency Medicine

## 2022-06-30 ENCOUNTER — Other Ambulatory Visit: Payer: Self-pay

## 2022-06-30 ENCOUNTER — Encounter (HOSPITAL_BASED_OUTPATIENT_CLINIC_OR_DEPARTMENT_OTHER): Payer: Self-pay | Admitting: Emergency Medicine

## 2022-06-30 ENCOUNTER — Emergency Department (HOSPITAL_BASED_OUTPATIENT_CLINIC_OR_DEPARTMENT_OTHER): Payer: Medicaid Other

## 2022-06-30 DIAGNOSIS — Y92410 Unspecified street and highway as the place of occurrence of the external cause: Secondary | ICD-10-CM | POA: Diagnosis not present

## 2022-06-30 DIAGNOSIS — Z7901 Long term (current) use of anticoagulants: Secondary | ICD-10-CM | POA: Diagnosis not present

## 2022-06-30 DIAGNOSIS — J45909 Unspecified asthma, uncomplicated: Secondary | ICD-10-CM | POA: Diagnosis not present

## 2022-06-30 DIAGNOSIS — Z91040 Latex allergy status: Secondary | ICD-10-CM | POA: Diagnosis not present

## 2022-06-30 DIAGNOSIS — I4891 Unspecified atrial fibrillation: Secondary | ICD-10-CM | POA: Diagnosis not present

## 2022-06-30 DIAGNOSIS — I10 Essential (primary) hypertension: Secondary | ICD-10-CM | POA: Diagnosis not present

## 2022-06-30 DIAGNOSIS — M7918 Myalgia, other site: Secondary | ICD-10-CM

## 2022-06-30 DIAGNOSIS — M791 Myalgia, unspecified site: Secondary | ICD-10-CM | POA: Diagnosis not present

## 2022-06-30 DIAGNOSIS — R519 Headache, unspecified: Secondary | ICD-10-CM | POA: Diagnosis present

## 2022-06-30 DIAGNOSIS — Z79899 Other long term (current) drug therapy: Secondary | ICD-10-CM | POA: Diagnosis not present

## 2022-06-30 NOTE — Discharge Instructions (Signed)
Please take tylenol/ibuprofen for pain. I recommend close follow-up with PCP for reevaluation.  Please do not hesitate to return to emergency department if worrisome signs symptoms we discussed become apparent.  
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
## 2022-07-16 ENCOUNTER — Other Ambulatory Visit: Payer: Self-pay

## 2022-07-16 ENCOUNTER — Emergency Department (HOSPITAL_COMMUNITY): Payer: Medicaid Other

## 2022-07-16 ENCOUNTER — Emergency Department (HOSPITAL_COMMUNITY)
Admission: EM | Admit: 2022-07-16 | Discharge: 2022-07-16 | Discharge status: Against Medical Advice | Payer: Medicaid Other | Attending: Emergency Medicine | Admitting: Emergency Medicine

## 2022-07-16 DIAGNOSIS — Z5321 Procedure and treatment not carried out due to patient leaving prior to being seen by health care provider: Secondary | ICD-10-CM | POA: Diagnosis not present

## 2022-07-16 DIAGNOSIS — Z7901 Long term (current) use of anticoagulants: Secondary | ICD-10-CM | POA: Diagnosis not present

## 2022-07-16 DIAGNOSIS — Z1152 Encounter for screening for COVID-19: Secondary | ICD-10-CM | POA: Insufficient documentation

## 2022-07-16 DIAGNOSIS — R1110 Vomiting, unspecified: Secondary | ICD-10-CM | POA: Insufficient documentation

## 2022-07-16 DIAGNOSIS — R059 Cough, unspecified: Secondary | ICD-10-CM | POA: Diagnosis not present

## 2022-07-16 DIAGNOSIS — M791 Myalgia, unspecified site: Secondary | ICD-10-CM | POA: Diagnosis not present

## 2022-07-16 DIAGNOSIS — R509 Fever, unspecified: Secondary | ICD-10-CM | POA: Insufficient documentation

## 2022-07-16 LAB — RESP PANEL BY RT-PCR (FLU A&B, COVID) ARPGX2
Influenza A by PCR: NEGATIVE
Influenza B by PCR: NEGATIVE
SARS Coronavirus 2 by RT PCR: NEGATIVE

## 2022-07-16 LAB — SARS CORONAVIRUS 2 BY RT PCR: SARS Coronavirus 2 by RT PCR: NEGATIVE

## 2022-07-16 NOTE — ED Provider Triage Note (Signed)
Emergency Medicine Provider Triage Evaluation Note  Christian Schroeder , a 41 y.o. male  was evaluated in triage.  Pt complains of cough, body aches, fevers, chills worse for the last 4 days.  States he is having some episodes of posttussive emesis.  States that his back and ribs are sore with coughing.  Patient with history of A-fib anticoagulated on Eliquis.  Review of Systems  Positive: As above nausea Negative: Vomiting diarrhea, CP, palpitations  Physical Exam  BP (!) 163/101 (BP Location: Right Arm)   Pulse (!) 112   Temp 99.2 F (37.3 C)   Resp 20   SpO2 95%  Gen:   Awake, visibly comfortable, coughing Resp:  Normal effort  MSK:   Moves extremities without difficulty  Other:  Mild wheezing lung bases, mildly tachycardic with regular rhythm abdomen soft nondistended nontender.  No posterior pharyngeal erythema.  Left TM erythematous  Medical Decision Making  Medically screening exam initiated at 3:03 AM.  Appropriate orders placed.  Christian Schroeder was informed that the remainder of the evaluation will be completed by another provider, this initial triage assessment does not replace that evaluation, and the importance of remaining in the ED until their evaluation is complete.  Albuterol offered to patient due to wheezing, however he stated he is "not comfortable taking albuterol as it can make your heart beat faster". This chart was dictated using voice recognition software, Dragon. Despite the best efforts of this provider to proofread and correct errors, errors may still occur which can change documentation meaning.    Paris Lore, PA-C 07/16/22 [phone]

## 2022-07-16 NOTE — ED Notes (Signed)
Patient states the wait is too long and he is leaving 
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
## 2022-07-21 ENCOUNTER — Emergency Department (HOSPITAL_BASED_OUTPATIENT_CLINIC_OR_DEPARTMENT_OTHER)
Admission: EM | Admit: 2022-07-21 | Discharge: 2022-07-21 | Disposition: A | Discharge status: Home / Self Care | Payer: Medicaid Other | Attending: Emergency Medicine | Admitting: Emergency Medicine

## 2022-07-21 ENCOUNTER — Other Ambulatory Visit: Payer: Self-pay

## 2022-07-21 ENCOUNTER — Encounter (HOSPITAL_BASED_OUTPATIENT_CLINIC_OR_DEPARTMENT_OTHER): Payer: Self-pay | Admitting: Emergency Medicine

## 2022-07-21 DIAGNOSIS — J181 Lobar pneumonia, unspecified organism: Secondary | ICD-10-CM | POA: Insufficient documentation

## 2022-07-21 DIAGNOSIS — J45909 Unspecified asthma, uncomplicated: Secondary | ICD-10-CM | POA: Insufficient documentation

## 2022-07-21 DIAGNOSIS — I10 Essential (primary) hypertension: Secondary | ICD-10-CM | POA: Diagnosis not present

## 2022-07-21 DIAGNOSIS — Z91040 Latex allergy status: Secondary | ICD-10-CM | POA: Insufficient documentation

## 2022-07-21 DIAGNOSIS — Z79899 Other long term (current) drug therapy: Secondary | ICD-10-CM | POA: Insufficient documentation

## 2022-07-21 DIAGNOSIS — J4531 Mild persistent asthma with (acute) exacerbation: Secondary | ICD-10-CM | POA: Insufficient documentation

## 2022-07-21 DIAGNOSIS — I4891 Unspecified atrial fibrillation: Secondary | ICD-10-CM | POA: Insufficient documentation

## 2022-07-21 DIAGNOSIS — Z7901 Long term (current) use of anticoagulants: Secondary | ICD-10-CM | POA: Diagnosis not present

## 2022-07-21 DIAGNOSIS — R059 Cough, unspecified: Secondary | ICD-10-CM | POA: Diagnosis present

## 2022-07-21 DIAGNOSIS — J189 Pneumonia, unspecified organism: Secondary | ICD-10-CM

## 2022-07-21 MED ORDER — DOXYCYCLINE HYCLATE 100 MG PO TABS: 100.0000 mg | ORAL_TABLET | Freq: Once | ORAL | Status: DC

## 2022-07-21 MED ORDER — AMOXICILLIN-POT CLAVULANATE 875-125 MG PO TABS
1.0000 | ORAL_TABLET | Freq: Once | ORAL | Status: AC
  Administered 2022-07-21: 1 via ORAL
  Filled 2022-07-21: qty 1

## 2022-07-21 MED ORDER — AMOXICILLIN-POT CLAVULANATE 875-125 MG PO TABS: 1.0000 | ORAL_TABLET | Freq: Two times a day (BID) | ORAL | 0 refills | Status: DC

## 2022-07-21 NOTE — ED Provider Notes (Signed)
[facility] EMERGENCY DEPARTMENT Provider Note   CSN: [id number] Arrival date & time: 07/21/22  1834     History  Chief Complaint  Patient presents with   Cough    Christian Schroeder is a 41 y.o. male.   Cough Patient is a 41 year old male with past medical history significant for A-fib on Eliquis, asthma, medication noncompliance, HTN, obesity  He is presented emergency room today with complaints of cough chest tightness and myalgias for 10 days.  He states that he has not had any fevers.  He went to [facility] emergency room a few days ago and had a chest x-ray done and came to the ER for evaluation today since he had to leave without being seen due to wait times.  Denies any leg swelling, hemoptysis, chest pain.  No fevers lightheadedness or dizziness   he is requesting a minimal workup and tells me that he primarily is here for antibiotics because he was told that his chest x-ray at [facility] showed a pneumonia.     Home Medications Prior to Admission medications   Medication Sig Start Date End Date Taking? Authorizing Provider  amoxicillin-clavulanate (AUGMENTIN) 875-125 MG tablet Take 1 tablet by mouth every 12 (twelve) hours. 07/21/22  Yes Lacreshia Bondarenko, Rodrigo Ran, PA  apixaban (ELIQUIS) 5 MG TABS tablet Take 1 tablet (5 mg total) by mouth 2 (two) times daily. 05/13/22   Ronney Asters, NP  diltiazem (CARDIZEM CD) 120 MG 24 hr capsule TAKE 1 CAPSULE BY MOUTH EVERY DAY 04/29/22   Chilton Si, MD  flecainide (TAMBOCOR) 50 MG tablet Take 1 tablet (50 mg total) by mouth 2 (two) times daily. 08/03/21   Chilton Si, MD  spironolactone (ALDACTONE) 25 MG tablet Take 1 tablet by mouth once daily 03/27/22   Chilton Si, MD      Allergies    Latex    Review of Systems   Review of Systems  Respiratory:  Positive for cough.     Physical Exam Updated Vital Signs BP (!) 161/129 (BP Location: Left Arm)   Pulse (!) 108   Temp 98.2 F (36.8 C) (Oral)   Resp 20    Ht 6\' 5"  (1.956 m)   Wt 136.1 kg   SpO2 94%   BMI 35.57 kg/m  Physical Exam Vitals and nursing note reviewed.  Constitutional:      General: He is not in acute distress. HENT:     Head: Normocephalic and atraumatic.     Nose: Nose normal.  Eyes:     General: No scleral icterus. Cardiovascular:     Rate and Rhythm: Normal rate and regular rhythm.     Pulses: Normal pulses.     Heart sounds: Normal heart sounds.  Pulmonary:     Effort: Pulmonary effort is normal. No respiratory distress.     Breath sounds: No wheezing.     Comments: Focal crackles in left lower lobe, some wheezing, speaking in full sentences, no tachypnea Abdominal:     Palpations: Abdomen is soft.     Tenderness: There is no abdominal tenderness.  Musculoskeletal:     Cervical back: Normal range of motion.     Right lower leg: No edema.     Left lower leg: No edema.  Skin:    General: Skin is warm and dry.     Capillary Refill: Capillary refill takes less than 2 seconds.  Neurological:     Mental Status: He is alert. Mental status is at baseline.  Psychiatric:        Mood and Affect: Mood normal.        Behavior: Behavior normal.     ED Results / Procedures / Treatments   Labs (all labs ordered are listed, but only abnormal results are displayed) Labs Reviewed - No data to display   EKG None  Radiology No results found.  Procedures Procedures    Medications Ordered in ED Medications  amoxicillin-clavulanate (AUGMENTIN) 875-125 MG per tablet 1 tablet (has no administration in time range)    ED Course/ Medical Decision Making/ A&P                           Medical Decision Making   Patient is a 41 year old male with past medical history significant for A-fib on Eliquis, asthma, medication noncompliance, HTN, obesity  He is presented emergency room today with complaints of cough chest tightness and myalgias for 10 days.  He states that he has not had any fevers.  He went to [facility]  emergency room a few days ago and had a chest x-ray done and came to the ER for evaluation today since he had to leave without being seen due to wait times.  Denies any leg swelling, hemoptysis, chest pain.  No fevers lightheadedness or dizziness   he is requesting a minimal workup and tells me that he primarily is here for antibiotics because he was told that his chest x-ray at [facility] showed a pneumonia.  Patient with focal right lower lobe crackles, I personally viewed chest x-ray from 5 days ago, negative COVID test at that time.  I personally viewed chest x-ray.  Agree with radiology read  There is a lingular pneumonia  Will treat with Augmentin given patient's comorbidities.  Return precautions discussed.  I did recommend breathing treatment given his wheezing he declines this.  He is concerned that I will increase his heart rate and states that he would just like antibiotics at this time.  Return precautions discussed he will follow-up with PCP.  Ambulatory, not tachypneic and speaking in full sentences.   Final Clinical Impression(s) / ED Diagnoses Final diagnoses:  Mild persistent asthma with exacerbation  Lingular pneumonia    Rx / DC Orders ED Discharge Orders          Ordered    amoxicillin-clavulanate (AUGMENTIN) 875-125 MG tablet  Every 12 hours        07/21/22 2303              Gailen Shelter, [place] 07/21/22 2309    Melene Plan, DO 07/22/22 1458

## 2022-07-21 NOTE — Discharge Instructions (Addendum)
You are being treated for pneumonia.  You will need to take the antibiotic Augmentin for the entire course even if your symptoms improve.  Tylenol 1000 L 6 hours  Please take all of your antibiotics until finished!   You may develop abdominal discomfort or diarrhea from the antibiotic.  You may help offset this with probiotics which you can buy or get in yogurt. Do not eat  or take the probiotics until 2 hours after your antibiotic.

## 2022-07-21 NOTE — ED Notes (Signed)
Patient refusing COVID swab. Notified ED provider.

## 2022-07-21 NOTE — ED Notes (Signed)
Pt has refused the swab for testing

## 2022-07-21 NOTE — ED Triage Notes (Signed)
Patient c/o chest congestion and tightness with cough and bodyaches since yesterday. States has had an intermittent fever.

## 2022-08-07 ENCOUNTER — Other Ambulatory Visit: Payer: Self-pay | Admitting: Cardiovascular Disease

## 2022-08-07 NOTE — Telephone Encounter (Signed)
Rx request sent to pharmacy.  

## 2022-08-20 ENCOUNTER — Encounter (HOSPITAL_BASED_OUTPATIENT_CLINIC_OR_DEPARTMENT_OTHER): Payer: Self-pay | Admitting: Cardiovascular Disease

## 2022-08-21 ENCOUNTER — Encounter (HOSPITAL_BASED_OUTPATIENT_CLINIC_OR_DEPARTMENT_OTHER): Payer: Self-pay | Admitting: Family

## 2022-08-21 ENCOUNTER — Ambulatory Visit (INDEPENDENT_AMBULATORY_CARE_PROVIDER_SITE_OTHER): Payer: Medicaid Other | Admitting: Family

## 2022-08-21 VITALS — BP 136/88 | HR 153 | Ht 77.0 in | Wt 313.0 lb

## 2022-08-21 DIAGNOSIS — R0683 Snoring: Secondary | ICD-10-CM

## 2022-08-21 DIAGNOSIS — D6859 Other primary thrombophilia: Secondary | ICD-10-CM | POA: Diagnosis not present

## 2022-08-21 DIAGNOSIS — I10 Essential (primary) hypertension: Secondary | ICD-10-CM | POA: Diagnosis not present

## 2022-08-21 DIAGNOSIS — E669 Obesity, unspecified: Secondary | ICD-10-CM

## 2022-08-21 DIAGNOSIS — I480 Paroxysmal atrial fibrillation: Secondary | ICD-10-CM

## 2022-08-21 DIAGNOSIS — Z5181 Encounter for therapeutic drug level monitoring: Secondary | ICD-10-CM

## 2022-08-21 MED ORDER — METOPROLOL SUCCINATE ER 50 MG PO TB24: 50.0000 mg | ORAL_TABLET | Freq: Every day | ORAL | 2 refills | Status: DC
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
## 2022-08-21 NOTE — Patient Instructions (Addendum)
Medication Instructions:  Your physician has recommended you make the following change in your medication:   START Metoprolol Succinate one 50mg  tablet daily  *If you need a refill on your cardiac medications before your next appointment, please call your pharmacy*   Lab Work: Your physician recommends that you return for lab work today: CBC, CMP, TSH, magnesium  If you have labs (blood work) drawn today and your tests are completely normal, you will receive your results only by: [place] (if you have MyChart) OR A paper copy in the mail If you have any lab test that is abnormal or we need to change your treatment, we will call you to review the results.   Testing/Procedures: Your EKG today showed rapid atrial fibrillation.       Dear Christian Schroeder  You are scheduled for a Cardioversion on Thursday, January 4 with Dr. Radford Pax.  Please arrive at the [facility] (Main Entrance A) at [facility]: [place], [place] [address] at 1:30 PM.   DIET:  Nothing to eat or drink after midnight except a sip of water with medications (see medication instructions below)  MEDICATION INSTRUCTIONS: Please take your normal medications- HOLD METOPROLOL   LABS: labs done today 1/3 at [facility]   FYI:  For your safety, and to allow [place] to monitor your vital signs accurately during the surgery/procedure we request: If you have artificial nails, gel coating, SNS etc, please have those removed prior to your surgery/procedure. Not having the nail coverings /polish removed may result in cancellation or delay of your surgery/procedure.  You must have a responsible person to drive you home and stay in the waiting area during your procedure. Failure to do so could result in cancellation.  Bring your insurance cards.  *Special Note: Every effort is made to have your procedure done on time. Occasionally there are emergencies that occur at the hospital that may cause delays. Please be  patient if a delay does occur.   Follow-Up: At [facility], you and your health needs are our priority.  As part of our continuing mission to provide you with exceptional heart care, we have created designated Provider Care Teams.  These Care Teams include your primary Cardiologist (physician) and Advanced Practice Providers (APPs -  Physician Assistants and Nurse Practitioners) who all work together to provide you with the care you need, when you need it.  We recommend signing up for the patient portal called "MyChart".  Sign up information is provided on this After Visit Summary.  MyChart is used to connect with patients for Virtual Visits (Telemedicine).  Patients are able to view lab/test results, encounter notes, upcoming appointments, etc.  Non-urgent messages can be sent to your provider as well.   To learn more about what you can do with MyChart, go to [email or web address].    Your next appointment:   2-3 week(s) after cardioversion  The format for your next appointment:   In Person  Provider:   Skeet Latch, MD or [name], NP    Other Instructions Heart Healthy Diet Recommendations: A low-salt diet is recommended. Meats should be grilled, baked, or boiled. Avoid fried foods. Focus on lean protein sources like fish or chicken with vegetables and fruits. The American Heart Association is a Microbiologist!  American Heart Association Diet and Lifeystyle Recommendations   Exercise recommendations: The American Heart Association recommends 150 minutes of moderate intensity exercise weekly. Try 30 minutes of moderate intensity exercise 4-5 times per  week. This could include walking, jogging, or swimming.
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
## 2022-08-22 ENCOUNTER — Ambulatory Visit (HOSPITAL_COMMUNITY): Payer: Medicaid Other | Admitting: Certified Registered Nurse Anesthetist

## 2022-08-22 ENCOUNTER — Ambulatory Visit (HOSPITAL_COMMUNITY)
Admission: RE | Admit: 2022-08-22 | Discharge: 2022-08-22 | Disposition: A | Discharge status: Home / Self Care | Payer: Medicaid Other | Attending: Cardiology | Admitting: Cardiology

## 2022-08-22 ENCOUNTER — Telehealth (HOSPITAL_BASED_OUTPATIENT_CLINIC_OR_DEPARTMENT_OTHER): Payer: Self-pay

## 2022-08-22 ENCOUNTER — Encounter (HOSPITAL_COMMUNITY)
Admission: RE | Disposition: A | Discharge status: Home / Self Care | Payer: Self-pay | Source: Home / Self Care | Attending: Cardiology

## 2022-08-22 DIAGNOSIS — Z538 Procedure and treatment not carried out for other reasons: Secondary | ICD-10-CM | POA: Diagnosis not present

## 2022-08-22 DIAGNOSIS — R0683 Snoring: Secondary | ICD-10-CM | POA: Insufficient documentation

## 2022-08-22 DIAGNOSIS — I10 Essential (primary) hypertension: Secondary | ICD-10-CM | POA: Diagnosis not present

## 2022-08-22 DIAGNOSIS — E669 Obesity, unspecified: Secondary | ICD-10-CM | POA: Diagnosis not present

## 2022-08-22 DIAGNOSIS — Z7901 Long term (current) use of anticoagulants: Secondary | ICD-10-CM | POA: Diagnosis not present

## 2022-08-22 DIAGNOSIS — J45909 Unspecified asthma, uncomplicated: Secondary | ICD-10-CM | POA: Insufficient documentation

## 2022-08-22 DIAGNOSIS — I480 Paroxysmal atrial fibrillation: Secondary | ICD-10-CM | POA: Diagnosis present

## 2022-08-22 LAB — COMPREHENSIVE METABOLIC PANEL
ALT: 76 IU/L — ABNORMAL HIGH (ref 0–44)
AST: 45 IU/L — ABNORMAL HIGH (ref 0–40)
Albumin/Globulin Ratio: 1.8 (ref 1.2–2.2)
Albumin: 4.5 g/dL (ref 4.1–5.1)
Alkaline Phosphatase: 95 IU/L (ref 44–121)
BUN/Creatinine Ratio: 10 (ref 9–20)
BUN: 11 mg/dL (ref 6–20)
Bilirubin Total: 0.2 mg/dL (ref 0.0–1.2)
CO2: 21 mmol/L (ref 20–29)
Calcium: 10.6 mg/dL — ABNORMAL HIGH (ref 8.7–10.2)
Chloride: 104 mmol/L (ref 96–106)
Creatinine, Ser: 1.09 mg/dL (ref 0.76–1.27)
Globulin, Total: 2.5 g/dL (ref 1.5–4.5)
Glucose: 126 mg/dL — ABNORMAL HIGH (ref 70–99)
Potassium: 4.1 mmol/L (ref 3.5–5.2)
Sodium: 142 mmol/L (ref 134–144)
Total Protein: 7 g/dL (ref 6.0–8.5)
eGFR: 89 mL/min/{1.73_m2} (ref >59)

## 2022-08-22 LAB — CBC
Hematocrit: 49.2 % (ref 37.5–51.0)
Hemoglobin: 16 g/dL (ref 13.0–17.7)
MCH: 26.8 pg (ref 26.6–33.0)
MCHC: 32.5 g/dL (ref 31.5–35.7)
MCV: 82 fL (ref 79–97)
Platelets: 392 10*3/uL (ref 150–450)
RBC: 5.97 x10E6/uL — ABNORMAL HIGH (ref 4.14–5.80)
RDW: 13.8 % (ref 11.6–15.4)
WBC: 8.8 10*3/uL (ref 3.4–10.8)

## 2022-08-22 LAB — TSH: TSH: 0.011 u[IU]/mL — ABNORMAL LOW (ref 0.450–4.500)

## 2022-08-22 LAB — MAGNESIUM: Magnesium: 2 mg/dL (ref 1.6–2.3)

## 2022-08-22 SURGERY — CANCELLED PROCEDURE

## 2022-08-22 NOTE — Anesthesia Preprocedure Evaluation (Signed)
Anesthesia Evaluation  Patient identified by MRN, date of birth, ID band Patient awake    Reviewed: Allergy & Precautions, NPO status , Patient's Chart, lab work & pertinent test results  Airway Mallampati: II  TM Distance: >3 FB Neck ROM: Full    Dental  (+) Teeth Intact, Dental Advisory Given   Pulmonary asthma , Current Smoker   Pulmonary exam normal breath sounds clear to auscultation       Cardiovascular hypertension, Pt. on home beta blockers and Pt. on medications + dysrhythmias Atrial Fibrillation  Rhythm:Irregular Rate:Abnormal     Neuro/Psych negative neurological ROS     GI/Hepatic negative GI ROS, Neg liver ROS,,,  Endo/Other  Obesity   Renal/GU negative Renal ROS     Musculoskeletal negative musculoskeletal ROS (+)    Abdominal   Peds  Hematology  (+) Blood dyscrasia (Eliquis)   Anesthesia Other Findings Day of surgery medications reviewed with the patient.  Reproductive/Obstetrics                             Anesthesia Physical Anesthesia Plan  ASA: 3  Anesthesia Plan: General   Post-op Pain Management: Minimal or no pain anticipated   Induction: Intravenous  PONV Risk Score and Plan: 1 and TIVA and Treatment may vary due to age or medical condition  Airway Management Planned: Mask  Additional Equipment:   Intra-op Plan:   Post-operative Plan:   Informed Consent: I have reviewed the patients History and Physical, chart, labs and discussed the procedure including the risks, benefits and alternatives for the proposed anesthesia with the patient or authorized representative who has indicated his/her understanding and acceptance.     Dental advisory given  Plan Discussed with: CRNA  Anesthesia Plan Comments:        Anesthesia Quick Evaluation

## 2022-08-22 NOTE — Interval H&P Note (Signed)
History and Physical Interval Note:  08/22/2022 1:42 PM  Christian Schroeder  has presented today for surgery, with the diagnosis of afib.  The various methods of treatment have been discussed with the patient and family. After consideration of risks, benefits and other options for treatment, the patient has consented to  Procedure(s): CARDIOVERSION (N/A) as a surgical intervention.  The patient's history has been reviewed, patient examined, no change in status, stable for surgery.  I have reviewed the patient's chart and labs.  Questions were answered to the patient's satisfaction.     Fransico Him

## 2022-08-22 NOTE — Telephone Encounter (Addendum)
[facility] contacted to add on T3 and T4. Will contact patient once we have add on testing results.     ----- Message from Loel Dubonnet, NP sent at 08/22/2022  9:54 AM EST ----- CBC with no evidence of anemia nor infection.  Normal electrolytes.   Liver enzymes elevated - please reduce intake of fried foods, Acetaminophen (tylenol), and alcohol. Repeat liver enzymes in 4-6 weeks for monitoring.   TSH (thyroid hormone) is low indicating possible overactive thyroid.  Can we please ask [facility] to add on T3/T4.

## 2022-08-22 NOTE — CV Procedure (Signed)
Patient in NSR on arrival in Endo.  Cardioversion cancelled

## 2022-08-22 NOTE — Progress Notes (Signed)
Patient arrived to endoscopy dept for cardioversion today. After EKG completed patient is in NSR. 12 lead EKG obtained confirmed NSR. Dr. Radford Pax at bedside aware, spoke with patient. Ok to DC patient home

## 2022-08-23 LAB — T4: T4, Total: 12.2 ug/dL — ABNORMAL HIGH (ref 4.5–12.0)

## 2022-08-23 LAB — T3: T3, Total: 207 ng/dL — ABNORMAL HIGH (ref 71–180)

## 2022-08-23 LAB — SPECIMEN STATUS REPORT

## 2022-08-28 ENCOUNTER — Other Ambulatory Visit (HOSPITAL_BASED_OUTPATIENT_CLINIC_OR_DEPARTMENT_OTHER): Payer: Self-pay | Admitting: Cardiovascular Disease

## 2022-08-28 NOTE — Telephone Encounter (Signed)
Rx(s) sent to pharmacy electronically.  

## 2022-09-09 ENCOUNTER — Ambulatory Visit (HOSPITAL_BASED_OUTPATIENT_CLINIC_OR_DEPARTMENT_OTHER): Payer: Medicaid Other | Admitting: Family
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
## 2022-10-19 ENCOUNTER — Other Ambulatory Visit: Payer: Self-pay | Admitting: General Practice
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
## 2022-10-21 NOTE — Telephone Encounter (Signed)
Prescription refill request for Eliquis received. Indication: PAF Last office visit: 08/21/22 Vella Raring NP Scr: 1.09 on 08/21/22 Age: 42 Weight: 142kg  Based on above findings Eliquis '5mg'$  twice daily is the appropriate dose.  Refill approved.

## 2022-10-23 ENCOUNTER — Ambulatory Visit (HOSPITAL_BASED_OUTPATIENT_CLINIC_OR_DEPARTMENT_OTHER): Payer: Medicaid Other | Admitting: Cardiovascular Disease

## 2022-11-28 ENCOUNTER — Other Ambulatory Visit: Payer: Self-pay | Admitting: Cardiovascular Disease

## 2022-11-28 NOTE — Telephone Encounter (Signed)
Rx request sent to pharmacy.  

## 2023-02-04 ENCOUNTER — Telehealth: Payer: Self-pay | Admitting: Cardiovascular Disease

## 2023-02-04 MED ORDER — FLECAINIDE ACETATE 50 MG PO TABS: 50.0000 mg | ORAL_TABLET | Freq: Two times a day (BID) | ORAL | 0 refills | Status: DC

## 2023-02-04 NOTE — Telephone Encounter (Signed)
*  STAT* If patient is at the pharmacy, call can be transferred to refill team.   1. Which medications need to be refilled? (please list name of each medication and dose if known)   flecainide (TAMBOCOR) 50 MG tablet    2. Which pharmacy/location (including street and city if local pharmacy) is medication to be sent to?   [facility] - [place], [place] - [address]    3. Do they need a 30 day or 90 day supply? 90

## 2023-02-04 NOTE — Telephone Encounter (Signed)
Please advise. DCCV cancelled back in January, no follow-up has been scheduled. Sending 90 day refill of flecainide per pt request with no refills.   When should pt follow-up?

## 2023-02-04 NOTE — Telephone Encounter (Signed)
Cardioversion cancelled as he self converted. Recommend f/u visit within 2-3 months to reassess BP as was mildly elevated at last visit and ensure maintaining NSR.   Alver Sorrow, NP

## 2023-02-04 NOTE — Telephone Encounter (Signed)
Spoke to pt. Scheduled him for 03/24/23 at 10:30 AM with Gillian Shields, NP.

## 2023-03-24 ENCOUNTER — Encounter (HOSPITAL_BASED_OUTPATIENT_CLINIC_OR_DEPARTMENT_OTHER): Payer: Self-pay | Admitting: Family

## 2023-03-24 ENCOUNTER — Ambulatory Visit (INDEPENDENT_AMBULATORY_CARE_PROVIDER_SITE_OTHER): Payer: Medicaid Other | Admitting: Family

## 2023-03-24 VITALS — BP 142/100 | HR 80 | Ht 77.0 in | Wt 322.0 lb

## 2023-03-24 DIAGNOSIS — I10 Essential (primary) hypertension: Secondary | ICD-10-CM | POA: Diagnosis not present

## 2023-03-24 DIAGNOSIS — I480 Paroxysmal atrial fibrillation: Secondary | ICD-10-CM

## 2023-03-24 DIAGNOSIS — R0683 Snoring: Secondary | ICD-10-CM | POA: Diagnosis not present

## 2023-03-24 DIAGNOSIS — D6859 Other primary thrombophilia: Secondary | ICD-10-CM | POA: Diagnosis not present

## 2023-03-24 MED ORDER — FLECAINIDE ACETATE 50 MG PO TABS: 50.0000 mg | ORAL_TABLET | Freq: Two times a day (BID) | ORAL | 2 refills | Status: DC

## 2023-03-24 MED ORDER — SPIRONOLACTONE 25 MG PO TABS: 25.0000 mg | ORAL_TABLET | Freq: Every day | ORAL | 2 refills | Status: DC

## 2023-03-24 MED ORDER — DILTIAZEM HCL ER COATED BEADS 120 MG PO CP24: 120.0000 mg | ORAL_CAPSULE | Freq: Every day | ORAL | 2 refills | Status: DC

## 2023-03-24 MED ORDER — APIXABAN 5 MG PO TABS: 5.0000 mg | ORAL_TABLET | Freq: Two times a day (BID) | ORAL | 2 refills | Status: DC
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
## 2023-03-24 NOTE — Patient Instructions (Addendum)
Medication Instructions:  Your physician recommends that you continue on your current medications as directed. Please refer to the Current Medication list given to you today.  Testing/Procedures: If you decide you would like a coronary calcium score just call and let us know.   Your provider has recommended a split night sleep study. A member of the scheduling team will call you to schedule this once approved by insurance.   Follow-Up: At [facility], you and your health needs are our priority.  As part of our continuing mission to provide you with exceptional heart care, we have created designated Provider Care Teams.  These Care Teams include your primary Cardiologist (physician) and Advanced Practice Providers (APPs -  Physician Assistants and Nurse Practitioners) who all work together to provide you with the care you need, when you need it.  We recommend signing up for the patient portal called "MyChart".  Sign up information is provided on this After Visit Summary.  MyChart is used to connect with patients for Virtual Visits (Telemedicine).  Patients are able to view lab/test results, encounter notes, upcoming appointments, etc.  Non-urgent messages can be sent to your provider as well.   To learn more about what you can do with MyChart, go to [email or web address].    Your next appointment:   6 months   Provider:   Chilton Si, MD or Gillian Shields, NP    Other Instructions

## 2023-04-07 ENCOUNTER — Encounter (HOSPITAL_BASED_OUTPATIENT_CLINIC_OR_DEPARTMENT_OTHER): Payer: Self-pay

## 2023-04-16 ENCOUNTER — Other Ambulatory Visit (HOSPITAL_BASED_OUTPATIENT_CLINIC_OR_DEPARTMENT_OTHER): Payer: Self-pay | Admitting: Cardiovascular Disease

## 2023-04-16 DIAGNOSIS — I480 Paroxysmal atrial fibrillation: Secondary | ICD-10-CM

## 2023-04-16 DIAGNOSIS — I10 Essential (primary) hypertension: Secondary | ICD-10-CM

## 2023-04-16 NOTE — Telephone Encounter (Signed)
Rx request sent to pharmacy.  

## 2023-06-20 ENCOUNTER — Other Ambulatory Visit: Payer: Self-pay

## 2023-06-20 ENCOUNTER — Telehealth: Payer: Self-pay

## 2023-06-20 DIAGNOSIS — I480 Paroxysmal atrial fibrillation: Secondary | ICD-10-CM

## 2023-06-20 DIAGNOSIS — E669 Obesity, unspecified: Secondary | ICD-10-CM

## 2023-06-20 DIAGNOSIS — R0683 Snoring: Secondary | ICD-10-CM

## 2023-06-20 NOTE — Telephone Encounter (Signed)
**Note De-Identified Jazzmine Kleiman Obfuscation** Per the [facility] homepage: [phone] - POLYSOM 6/>YRS CPAP 4/> PARM For participating providers, no pre-authorization is required for members under 40 years old. For all other members, pre-authorization is required for all providers.

## 2023-07-18 ENCOUNTER — Ambulatory Visit (HOSPITAL_BASED_OUTPATIENT_CLINIC_OR_DEPARTMENT_OTHER): Payer: Medicaid Other | Attending: Family | Admitting: Cardiology

## 2023-07-18 DIAGNOSIS — G4736 Sleep related hypoventilation in conditions classified elsewhere: Secondary | ICD-10-CM | POA: Diagnosis not present

## 2023-07-18 DIAGNOSIS — R0683 Snoring: Secondary | ICD-10-CM | POA: Diagnosis present

## 2023-07-18 DIAGNOSIS — I480 Paroxysmal atrial fibrillation: Secondary | ICD-10-CM | POA: Diagnosis not present

## 2023-07-18 DIAGNOSIS — G4733 Obstructive sleep apnea (adult) (pediatric): Secondary | ICD-10-CM | POA: Diagnosis not present

## 2023-07-18 DIAGNOSIS — E669 Obesity, unspecified: Secondary | ICD-10-CM | POA: Insufficient documentation

## 2023-07-18 DIAGNOSIS — Z6830 Body mass index (BMI) 30.0-30.9, adult: Secondary | ICD-10-CM | POA: Insufficient documentation

## 2023-07-23 ENCOUNTER — Telehealth: Payer: Self-pay

## 2023-07-23 DIAGNOSIS — G4733 Obstructive sleep apnea (adult) (pediatric): Secondary | ICD-10-CM

## 2023-07-23 DIAGNOSIS — E669 Obesity, unspecified: Secondary | ICD-10-CM

## 2023-07-23 NOTE — Procedures (Signed)
   Patient Name: Christian Schroeder, [place] Date: 07/18/2023 Gender: Male D.O.B: 04/01/1981 Age (years): 42 Referring Provider: Chilton Si Height (inches): 75 Interpreting Physician: Armanda Magic MD, ABSM Weight (lbs): 322 RPSGT: Cherylann Parr BMI: 40 MRN: [id number] Neck Size: 18.00  CLINICAL INFORMATION Sleep Study Type: NPSG  Indication for sleep study: Obesity, Snoring, Witnesses Apnea / Gasping During Sleep  Epworth Sleepiness Score: 12  SLEEP STUDY TECHNIQUE As per the AASM Manual for the Scoring of Sleep and Associated Events v2.3 (April 2016) with a hypopnea requiring 4% desaturations.  The channels recorded and monitored were frontal, central and occipital EEG, electrooculogram (EOG), submentalis EMG (chin), nasal and oral airflow, thoracic and abdominal wall motion, anterior tibialis EMG, snore microphone, electrocardiogram, and pulse oximetry.  MEDICATIONS Medications self-administered by patient taken the night of the study : N/A  SLEEP ARCHITECTURE The study was initiated at 11:00:51 PM and ended at 5:02:26 AM.  Sleep onset time was 85.3 minutes and the sleep efficiency was 73.9%. The total sleep time was 267.3 minutes.  Stage REM latency was 67.5 minutes.  The patient spent 2.1% of the night in stage N1 sleep, 87.5% in stage N2 sleep, 0.0% in stage N3 and 10.5% in REM.  Alpha intrusion was absent.  Supine sleep was 46.19%.  RESPIRATORY PARAMETERS The overall apnea/hypopnea index (AHI) was 28.7 per hour. There were 33 total apneas, including 33 obstructive, 0 central and 0 mixed apneas. There were 95 hypopneas and 0 RERAs.  The AHI during Stage REM sleep was 64.3 per hour.  AHI while supine was 33.5 per hour.  The mean oxygen saturation was 90.8%. The minimum SpO2 during sleep was 74.0%.  loud snoring was noted during this study.  CARDIAC DATA The 2 lead EKG demonstrated sinus rhythm. The mean heart rate was 72.2 beats per minute. Other EKG findings  include: PVCs and PACs  LEG MOVEMENT DATA The total PLMS were 0 with a resulting PLMS index of 0.0. Associated arousal with leg movement index was 0.0 .  IMPRESSIONS - Moderate obstructive sleep apnea occurred during this study (AHI = 28.7/h). - Severe oxygen desaturation was noted during this study (Min O2 =74.0%). - The patient snored with loud snoring volume. - PACs and PVCs were noted during this study. - Clinically significant periodic limb movements did not occur during sleep. No significant associated arousals.  DIAGNOSIS - Obstructive Sleep Apnea (G47.33) - Nocturnal Hypoxemia (G47.36)  RECOMMENDATIONS - Therapeutic CPAP titration to determine optimal pressure required to alleviate sleep disordered breathing. - Avoid alcohol, sedatives and other CNS depressants that may worsen sleep apnea and disrupt normal sleep architecture. - Sleep hygiene should be reviewed to assess factors that may improve sleep quality. - Weight management and regular exercise should be initiated or continued if appropriate.  [Electronically signed] 07/23/2023 11:00 AM  Armanda Magic MD, ABSM Diplomate, American Board of Sleep Medicine

## 2023-07-23 NOTE — Telephone Encounter (Signed)
Notified patient of sleep study results and recommendations. All questions were answered and patient verbalized understanding. CPAP Titration ordered today.

## 2023-07-23 NOTE — Telephone Encounter (Signed)
-----   Message from Armanda Magic sent at 07/23/2023 11:01 AM EST ----- Please let patient know that they have sleep apnea.  Recommend therapeutic CPAP titration for treatment of patient's sleep disordered breathing.  If unable to perform an in lab titration then initiate ResMed auto CPAP from 4 to 15cm H2O with heated humidity and mask of choice and overnight pulse ox on CPAP.

## 2023-11-12 ENCOUNTER — Telehealth: Payer: Self-pay | Admitting: Cardiovascular Disease

## 2023-11-12 NOTE — Telephone Encounter (Signed)
 Spent over 15 minutes on the phone discussing symptoms patient is having Was sick with cold last week Thinks shortness of breath started after sickness but unsure because he did not exert himself while sick Woke up this am with heart "fluttering" improved after taking medications Has been experiencing dizziness after eating and fatigue Missed one night of Eliquis about 6 days ago Did have chest pain and shortness of breath with exertion few days ago  Over the last several months has been having burning in his chest with exertion, relieved with rest   Advised patient if chest pain/shortness of breath returns go to ED for evaluation   Did explain to patient has missed/cancelled last few follow up visits   Will forward to Ronn Melena NP for review

## 2023-11-12 NOTE — Telephone Encounter (Signed)
 Pt sent this via Mychart to the scheduling pool:    Shortness of breath resting, also moving. The shortness of breath is coming and going at this point. Haven't been able to work in a few days. Heart beat is fluttering on and off . I also have started getting dizzy when I eat, regardless of what I eat. I have cold symptoms also being that I'm getting over a cold.   ----- Message -----     Christian Schroeder  Can you tell me a little more about your Shortness of breath?    1. Are you currently SOB (can you hear that pt is SOB on the phone)?   2. How long have you been experiencing SOB?   3. Are you SOB when sitting or when up moving around?   4. Are you currently experiencing any other symptoms?       Appointment Request From: Christian Schroeder  With Provider: Alver Sorrow [facility] [address]]  Preferred Date Range: Any  Preferred Times: Any Time  Reason for visit: Office Visit  Health Maintenance Topic:   Comments: Dizziness after eating. Very weak. Shortness in breath.

## 2023-11-13 NOTE — Telephone Encounter (Signed)
 Prior chest discomfort at visit 03/2023 c/w musculoskeletal discomfort. Ensure taking Flecainide 50mg  BID and Diltiazem 120mg  daily. Illness can prompt more dyspnea as well as palpitations. Ensure avoiding caffeine, staying well hydrated. Concerns best addressed via office visit with EKG.   Available OV as of this time: 11/14/23 at 9:15 AM with Alver Sorrow, NP (hold placed on slot for him, if not used please delete hold) 11/19/23 at 10:05 AM with Edd Fabian, NP at [facility] (okay to use TOC as <7 days from visit) 11/20/23 at 3:10PM with Jari Favre, PA at [facility]  Alver Sorrow, NP

## 2023-11-13 NOTE — Telephone Encounter (Signed)
 Called patient and provided the below recommendations, pt scheduled for 3/28      Per Gillian Shields, NP   "Prior chest discomfort at visit 03/2023 c/w musculoskeletal discomfort. Ensure taking Flecainide 50mg  BID and Diltiazem 120mg  daily. Illness can prompt more dyspnea as well as palpitations. Ensure avoiding caffeine, staying well hydrated. Concerns best addressed via office visit with EKG.    Available OV as of this time: ? 11/14/23 at 9:15 AM with Alver Sorrow, NP (hold placed on slot for him, if not used please delete hold) ? 11/19/23 at 10:05 AM with Edd Fabian, NP at [facility] (okay to use TOC as <7 days from visit) ? 11/20/23 at 3:10PM with Jari Favre, PA at [facility]   Alver Sorrow, NP"

## 2023-11-14 ENCOUNTER — Ambulatory Visit (INDEPENDENT_AMBULATORY_CARE_PROVIDER_SITE_OTHER): Admitting: Family

## 2023-11-14 ENCOUNTER — Encounter (HOSPITAL_BASED_OUTPATIENT_CLINIC_OR_DEPARTMENT_OTHER): Payer: Self-pay | Admitting: Family

## 2023-11-14 VITALS — BP 144/92 | HR 91 | Ht 77.0 in | Wt 328.7 lb

## 2023-11-14 DIAGNOSIS — I10 Essential (primary) hypertension: Secondary | ICD-10-CM | POA: Diagnosis not present

## 2023-11-14 DIAGNOSIS — Z720 Tobacco use: Secondary | ICD-10-CM

## 2023-11-14 DIAGNOSIS — G4733 Obstructive sleep apnea (adult) (pediatric): Secondary | ICD-10-CM

## 2023-11-14 DIAGNOSIS — E66812 Obesity, class 2: Secondary | ICD-10-CM | POA: Diagnosis not present

## 2023-11-14 DIAGNOSIS — Z6838 Body mass index (BMI) 38.0-38.9, adult: Secondary | ICD-10-CM

## 2023-11-14 NOTE — Patient Instructions (Addendum)
 Medication Instructions:  Your physician recommends that you continue on your current medications as directed. Please refer to the Current Medication list given to you today.  Follow-Up: At [facility], you and your health needs are our priority.  As part of our continuing mission to provide you with exceptional heart care, our providers are all part of one team.  This team includes your primary Cardiologist (physician) and Advanced Practice Providers or APPs (Physician Assistants and Nurse Practitioners) who all work together to provide you with the care you need, when you need it.  Your next appointment:   6 months with Dr. Duke Salvia or Gillian Shields, NP   We recommend signing up for the patient portal called "MyChart".  Sign up information is provided on this After Visit Summary.  MyChart is used to connect with patients for Virtual Visits (Telemedicine).  Patients are able to view lab/test results, encounter notes, upcoming appointments, etc.  Non-urgent messages can be sent to your provider as well.   To learn more about what you can do with MyChart, go to [email or web address].   Other Instructions Your sleep study did show moderate sleep apnea and that your oxygen level dropped to 74%.

## 2023-11-14 NOTE — Progress Notes (Signed)
 Cardiology Office Note:  .   Date:  11/14/2023  ID:  Rayane Gallardo, DOB 1981/06/08, MRN [id number] PCP: Patient, No Pcp Per  [facility] Providers Cardiologist:  Chilton Si, MD    History of Present Illness: .   Pheng Prokop is a 43 y.o. male  with a hx of atrial fibrillation, asthma, hypertension, obesity, prior tobacco use.   Initially found of atrial fibrillation and underwent TEE/DCCV at age 89.  Subsequently underwent ablation while living in [place].  Awake 04/2017 with recurrent atrial fibrillation.  He underwent DCCV and follow-up in atrial fibrillation clinic 04/2017.  Echo LVEF 55 to 60%, mildly dilated left atrium.  He was started on diltiazem and Eliquis.  He has previously canceled multiple sleep studies. Lisinopril previously switched to Pranactin 25 mg daily.   Seen 08/21/22 with symptomatic atrial fibrillation. Toprol 50mg  daily initiated until cardioversion. However, on arrival for DCCV he was already in NSR. At visit 03/2023 he was experiencing musculoskeletal chest pain due to new exercise regimen but otherwise doing well. He was also 2 weeks tobacco free.  Since last seen sleep study revealed moderate OSA and awaits scheduling of CPAP titration.    He presents today concerns of shortness of breath. 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 Today he feels that his sickness has improved and he worked in the yard cutting down trees to see how he would respond. He reports no issues during the activity. He also has experienced some occasional instances of waking up after dozing off to get a good breath. He does have a rare burning sensation that feels like the medication he received with is chemical stress test. He has been working to increase his activity by going to the gym. He does note concern that his weight gain may be related to cardiac medications. Reports  no chest pain, pressure, or tightness. No edema, orthopnea, PND. Reports no palpitations.   ROS: Please see the history of present illness.    All other systems reviewed and are negative.   Studies Reviewed: Marland Kitchen   EKG Interpretation Date/Time:  Friday November 14 2023 09:29:02 EDT Ventricular Rate:  71 PR Interval:  172 QRS Duration:  112 QT Interval:  372 QTC Calculation: 404 R Axis:   86  Text Interpretation: Normal sinus rhythm Lateral TWI consistent with LVH TWI noted in inferior leads Confirmed by Gillian Shields ([id number]) on 11/14/2023 4:51:53 PM       Risk Assessment/Calculations:     CHA2DS2-VASc Score = 1   This indicates a 0.6% annual risk of stroke. The patient's score is based upon: CHF History: 0 HTN History: 1 Diabetes History: 0 Stroke History: 0 Vascular Disease History: 0 Age Score: 0 Gender Score: 0          STOP-Bang Score:         Physical Exam:   VS:  BP (!) 144/92   Pulse 91   Ht 6\' 5"  (1.956 m)   Wt (!) 328 lb 11.2 oz (149.1 kg)   SpO2 100%   BMI 38.98 kg/m    Wt Readings from Last 3 Encounters:  11/14/23 (!) 328 lb 11.2 oz (149.1 kg)  03/24/23 (!) 322 lb (146.1 kg)  08/21/22 (!) 313 lb (142 kg)    GEN: Well nourished, overweight, well developed in no acute distress NECK: No JVD; No carotid bruits CARDIAC: RRR, no murmurs, rubs, gallops  RESPIRATORY:  Clear to auscultation without rales, wheezing or rhonchi  ABDOMEN: Soft, non-tender, non-distended EXTREMITIES:  No edema; No deformity   ASSESSMENT AND PLAN: .    DOE / Abnormal EKG / Chest burning - He does report occasional chest burning different than heartburn but exercising without symptoms.  No indication for ischemic evaluation as exercising without symptoms. Consider etiology musculoskeletal for burning chest discomfort. Symptoms were previously exacerbated with viral URI now improving. EKG today stable lateral TWI related to LVH slight progression in inferior TWI. Given improving  symptoms, he prefers careful monitoring. Discussed cardiac CTA if symptoms recur or worsen.   PAF/hypercoagulable state- EKG today shows normal sinus rhythm with minimal voltage criteria for LVH, ST & T wave abnormality, consider inferolateral ischemia.Continue flecainide 50 mg BID and diltiazem 120 mg daily. Reassured weight gain unrelated to Flecainide. Offered referral to EP for consideration of alternate AAD which he will consider.   Hypertension- BP today was elevated. Continue diltiazem 120 mg and spironolactone 25 mg. He declines to add a medication such as ARB or increase diltiazem dose. Will reconsider next visit. Recommend to monitor BP at home at least 2 hours after medications and sitting for 5-10 minutes.    Obesity- He is increasing his activity with gym workouts. He attributes weight gain to Flecanide and educated that this is not likely to be a contributing factor. He is open to nutritional counseling. Nutritionalist referral placed. Recommend aiming for 150 minutes of moderate intensity activity per week and following a heart healthy diet. Given information for Right Start exercise program.   OSA - Awaiting scheduling of CPAP titration. Encouraged trial of CPAP machine with some hesitancy from patient.   Tobacco use - Cessation education provided. Smoking cessation encouraged.        Dispo: follow up in 6 months.   Signed, Alver Sorrow, NP

## 2023-11-26 ENCOUNTER — Other Ambulatory Visit: Payer: Self-pay

## 2023-11-26 ENCOUNTER — Encounter (HOSPITAL_COMMUNITY): Payer: Self-pay | Admitting: *Deleted

## 2023-11-26 ENCOUNTER — Emergency Department (HOSPITAL_COMMUNITY)
Admission: EM | Admit: 2023-11-26 | Discharge: 2023-11-27 | Disposition: A | Discharge status: Home / Self Care | Attending: Emergency Medicine | Admitting: Emergency Medicine

## 2023-11-26 ENCOUNTER — Emergency Department (HOSPITAL_COMMUNITY)

## 2023-11-26 DIAGNOSIS — Z91040 Latex allergy status: Secondary | ICD-10-CM | POA: Insufficient documentation

## 2023-11-26 DIAGNOSIS — Z7901 Long term (current) use of anticoagulants: Secondary | ICD-10-CM | POA: Diagnosis not present

## 2023-11-26 DIAGNOSIS — R0609 Other forms of dyspnea: Secondary | ICD-10-CM | POA: Diagnosis not present

## 2023-11-26 DIAGNOSIS — R079 Chest pain, unspecified: Secondary | ICD-10-CM

## 2023-11-26 DIAGNOSIS — M791 Myalgia, unspecified site: Secondary | ICD-10-CM | POA: Diagnosis not present

## 2023-11-26 LAB — BASIC METABOLIC PANEL WITH GFR
Anion gap: 9 (ref 5–15)
BUN: 11 mg/dL (ref 6–20)
CO2: 26 mmol/L (ref 22–32)
Calcium: 9.7 mg/dL (ref 8.9–10.3)
Chloride: 102 mmol/L (ref 98–111)
Creatinine, Ser: 1.08 mg/dL (ref 0.61–1.24)
GFR, Estimated: >60 mL/min (ref >60)
Glucose, Bld: 94 mg/dL (ref 70–99)
Potassium: 3.7 mmol/L (ref 3.5–5.1)
Sodium: 137 mmol/L (ref 135–145)

## 2023-11-26 LAB — RESP PANEL BY RT-PCR (RSV, FLU A&B, COVID)  RVPGX2
Influenza A by PCR: NEGATIVE
Influenza B by PCR: NEGATIVE
Resp Syncytial Virus by PCR: NEGATIVE
SARS Coronavirus 2 by RT PCR: NEGATIVE

## 2023-11-26 LAB — CBC
HCT: 46.4 % (ref 39.0–52.0)
Hemoglobin: 15 g/dL (ref 13.0–17.0)
MCH: 27.2 pg (ref 26.0–34.0)
MCHC: 32.3 g/dL (ref 30.0–36.0)
MCV: 84.2 fL (ref 80.0–100.0)
Platelets: 380 10*3/uL (ref 150–400)
RBC: 5.51 MIL/uL (ref 4.22–5.81)
RDW: 14.5 % (ref 11.5–15.5)
WBC: 8.7 10*3/uL (ref 4.0–10.5)
nRBC: 0 % (ref 0.0–0.2)

## 2023-11-26 LAB — TROPONIN I (HIGH SENSITIVITY): Troponin I (High Sensitivity): 7 ng/L (ref <18)

## 2023-11-26 NOTE — ED Provider Notes (Signed)
 [place] EMERGENCY DEPARTMENT AT [facility] Provider Note   CSN: [id number] Arrival date & time: 11/26/23  2029     History  Chief Complaint  Patient presents with   Generalized Body Aches    Burleigh Brockmann is a 43 y.o. male.  This is a 43 year old male here today for feeling unwell for a couple of weeks.  Patient is having bodyaches, chest pain.  He has a history of atrial fibrillation on flecainide, takes Eliquis.        Home Medications Prior to Admission medications   Medication Sig Start Date End Date Taking? Authorizing Provider  apixaban (ELIQUIS) 5 MG TABS tablet Take 1 tablet (5 mg total) by mouth 2 (two) times daily. 03/24/23   Alver Sorrow, NP  diltiazem (CARDIZEM CD) 120 MG 24 hr capsule TAKE 1 CAPSULE BY MOUTH EVERY DAY 04/16/23   Chilton Si, MD  flecainide (TAMBOCOR) 50 MG tablet Take 1 tablet (50 mg total) by mouth 2 (two) times daily. 03/24/23   Alver Sorrow, NP  spironolactone (ALDACTONE) 25 MG tablet Take 1 tablet (25 mg total) by mouth daily. 03/24/23   Alver Sorrow, NP      Allergies    Latex    Review of Systems   Review of Systems  Physical Exam Updated Vital Signs BP (!) 156/109 (BP Location: Left Arm)   Pulse 74   Temp 97.6 F (36.4 C)   Resp 15   Ht 6\' 5"  (1.956 m)   Wt (!) 149.1 kg   SpO2 100%   BMI 38.98 kg/m  Physical Exam Vitals reviewed.  Cardiovascular:     Rate and Rhythm: Normal rate.     Heart sounds: No murmur heard. Pulmonary:     Effort: Pulmonary effort is normal.  Abdominal:     General: Abdomen is flat.  Musculoskeletal:        General: Normal range of motion.  Neurological:     General: No focal deficit present.     Mental Status: He is alert.     ED Results / Procedures / Treatments   Labs (all labs ordered are listed, but only abnormal results are displayed) Labs Reviewed  RESP PANEL BY RT-PCR (RSV, FLU A&B, COVID)  RVPGX2  BASIC METABOLIC PANEL WITH GFR  CBC  BRAIN  NATRIURETIC PEPTIDE  URINALYSIS, W/ REFLEX TO CULTURE (INFECTION SUSPECTED)  TROPONIN I (HIGH SENSITIVITY)  TROPONIN I (HIGH SENSITIVITY)    EKG EKG Interpretation Date/Time:  Wednesday November 26 2023 21:42:47 EDT Ventricular Rate:  74 PR Interval:  185 QRS Duration:  97 QT Interval:  372 QTC Calculation: 413 R Axis:   88  Text Interpretation: Sinus rhythm Probable left ventricular hypertrophy Nonspecific T abnormalities, inferior leads Borderline ST elevation, lateral leads Abnormal EKG Confirmed by Anders Simmonds [phone]) on 11/26/2023 9:54:06 PM  Radiology No results found.  Procedures Procedures    Medications Ordered in ED Medications - No data to display  ED Course/ Medical Decision Making/ A&P                                 Medical Decision Making 43 year old male here today for chest pain, fatigue of 2 to 3 weeks duration, chest pain worsening over the last 1 day.  Plan -patient's initial EKG was borderline for STEMI activation.  When handed the EKG, I asked the patient be immediately brought to a room for evaluation.  I was handed a second EKG which showed less acute ischemia than initial.  When the patient was brought to her room, a 3rd EKG was done which did not meet STEMI criteria.  My dependent review of the patient's initial concerning EKG shows ST segment elevations in V2, V3, V4 and V5.  There is no reciprocal ST depression, however there are T wave inversions in 2, 3.  Patient has persistent T wave inversions in V5 and V6 from prior EKG.    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  He has been feeling rundown, fatigued.  He says that beginning today he had a tight pain over the left side of his chest with radiation down his left arm.  At rest he does not feel short of breath.  With the patient's subsequent EKGs, elected not to immediately activate patient as a STEMI.   Will await troponin testing.  Will reach out to cardiology.  Reassessment 10:55 PM-patient's troponin not elevated at 7.  Patient will require delta troponin testing.  Have added on a BNP and urinalysis from the triage labs.  My independent review the patient's chest x-ray shows no pulmonary edema or pneumonia.  Reassessment 11:10 PM-spoke with on-call cardiologist Dr. Lynnea Ferrier.  We discussed the patient's EKGs and the story.  He agreed that the patient did not meet STEMI criteria, but he would come down and evaluate the patient.  Patient will be signed out to Dr. Eudelia Bunch pending cardiology consultation, delta troponin.  Amount and/or Complexity of Data Reviewed Labs: ordered.           Final Clinical Impression(s) / ED Diagnoses Final diagnoses:  Chest pain, unspecified type    Rx / DC Orders ED Discharge Orders     None         Anders Simmonds T, DO 11/26/23 2314

## 2023-11-26 NOTE — ED Notes (Signed)
 Cardiology @ bedside.

## 2023-11-26 NOTE — ED Provider Triage Note (Signed)
 Emergency Medicine Provider Triage Evaluation Note  Christian Schroeder , a 43 y.o. male  was evaluated in triage.  Pt complains of bodyaches.  Patient reports has been feeling somewhat unwell for the last 2 to 3 weeks.  Endorses that he had a cough about 3 weeks back but this has worsened.  Now feeling like he has diffuse bodyaches.  Denies any recent fevers or chills as far as he has noted.  Has a history of atrial fibrillation and is currently on flecainide.  States he has been taking his medications as prescribed.  He does report that he was seen by his cardiologist about a week ago and had a normal visit at that time.  He does endorse feelings of "mentholated breathing".  This has not improved.  Review of Systems  Positive: As above Negative: As above  Physical Exam  BP (!) 161/115 (BP Location: Left Arm)   Pulse 79   Temp 97.6 F (36.4 C)   Ht 6\' 5"  (1.956 m)   Wt (!) 149.1 kg   SpO2 99%   BMI 38.98 kg/m  Gen:   Awake, no distress   Resp:  Normal effort  MSK:   Moves extremities without difficulty  Other:    Medical Decision Making  Medically screening exam initiated at 9:17 PM.  Appropriate orders placed.  Samer Dutton was informed that the remainder of the evaluation will be completed by another provider, this initial triage assessment does not replace that evaluation, and the importance of remaining in the ED until their evaluation is complete.     Smitty Knudsen, PA-C 11/26/23 2118

## 2023-11-26 NOTE — ED Triage Notes (Signed)
 The pt has multiple complaints chest pain lt arm pain  abd pain fatigue cough  recent bad cold  weakness all this for 2-3 weeks . Hx of af

## 2023-11-27 ENCOUNTER — Telehealth (HOSPITAL_BASED_OUTPATIENT_CLINIC_OR_DEPARTMENT_OTHER): Payer: Self-pay | Admitting: Family

## 2023-11-27 DIAGNOSIS — I480 Paroxysmal atrial fibrillation: Secondary | ICD-10-CM

## 2023-11-27 DIAGNOSIS — I10 Essential (primary) hypertension: Secondary | ICD-10-CM

## 2023-11-27 DIAGNOSIS — R079 Chest pain, unspecified: Secondary | ICD-10-CM

## 2023-11-27 LAB — URINALYSIS, W/ REFLEX TO CULTURE (INFECTION SUSPECTED)
Bacteria, UA: NONE SEEN
Bilirubin Urine: NEGATIVE
Glucose, UA: NEGATIVE mg/dL
Hgb urine dipstick: NEGATIVE
Ketones, ur: NEGATIVE mg/dL
Nitrite: NEGATIVE
Protein, ur: NEGATIVE mg/dL
Specific Gravity, Urine: 1.027 (ref 1.005–1.030)
pH: 5 (ref 5.0–8.0)

## 2023-11-27 LAB — BRAIN NATRIURETIC PEPTIDE: B Natriuretic Peptide: 7.4 pg/mL (ref 0.0–100.0)

## 2023-11-27 LAB — TROPONIN I (HIGH SENSITIVITY): Troponin I (High Sensitivity): 6 ng/L (ref <18)

## 2023-11-27 MED ORDER — DILTIAZEM HCL ER COATED BEADS 180 MG PO CP24: 180.0000 mg | ORAL_CAPSULE | Freq: Every day | ORAL | 11 refills | Status: DC

## 2023-11-27 NOTE — Telephone Encounter (Signed)
 Pt c/o medication issue:  1. Name of Medication: did not know the name of the blood pressure medicine  2. How are you currently taking this medication (dosage and times per day)?   3. Are you having a reaction (difficulty breathing--STAT)?   4. What is your medication issue? Patient said when he last saw [facility], she had discussed him starting on a new blood pressure medicine.  Patient says he have decided to try the new medicine

## 2023-11-27 NOTE — Telephone Encounter (Signed)
 Increase diltiazem to 180mg  daily. Continue spironolactone 25mg  daily.   Alver Sorrow, NP

## 2023-11-27 NOTE — Telephone Encounter (Signed)
 Returned call to patient, increased dose sent to pharmacy., instructed pt to call in 1-2 weeks if BP still elevated.

## 2023-11-27 NOTE — Telephone Encounter (Signed)
 Per last office note,   "Hypertension- BP today was elevated. Continue diltiazem 120 mg and spironolactone 25 mg. He declines to add a medication such as ARB or increase diltiazem dose. Will reconsider next visit. Recommend to monitor BP at home at least 2 hours after medications and sitting for 5-10 minutes. "  Called patient to get recent blood pressure readings for medication dosing. He states they have been running around 150-160s/90s. Yesterday at the hospital it was extremely high 156/109.

## 2023-11-27 NOTE — ED Provider Notes (Signed)
 I assumed care of this patient from previous provider.  Please see their note for further details of history, exam, and MDM.   Briefly patient is a 43 y.o. male who presented after feeling unwell with bodyaches and chest pain.  Initial workup is reassuring.  Awaiting delta troponin and recommendations from cardiology.  Dr. Lendell Caprice from cardiology evaluated the patient and recommended obs admission for echocardiogram and CT coronary study.  Patient declined to stay will like to follow-up closely with cardiology for the continued workup.  Delta troponin negative.  Ambulatory referral to cardiology placed.  The patient appears reasonably screened and/or stabilized for discharge and I doubt any other medical condition or other [facility] requiring further screening, evaluation, or treatment in the ED at this time. I have discussed the findings, Dx and Tx plan with the patient/family who expressed understanding and agree(s) with the plan. Discharge instructions discussed at length. The patient/family was given strict return precautions who verbalized understanding of the instructions. No further questions at time of discharge.  Disposition: Discharge  Condition: Good  ED Discharge Orders          Ordered    Ambulatory referral to Cardiology       Comments: If you have not heard from the Cardiology office within the next 72 hours please call [phone].   11/27/23 9562                 Nira Conn, MD 11/27/23 [phone]

## 2023-11-27 NOTE — ED Notes (Signed)
 Pt leaving, stating that he cannot stay any longer

## 2023-11-27 NOTE — ED Notes (Signed)
PT states he is unable to provide a urine sample at this time.

## 2023-11-27 NOTE — Consult Note (Signed)
 Cardiology Consultation   Patient ID: [name] MRN: [id number]; DOB: 1981/04/30  Admit date: 11/26/2023 Date of Consult: 11/27/2023  PCP:  Patient, No Pcp Per   [facility] Providers Cardiologist:  Christian Si, MD   { Click here to update MD or APP on Care Team, Refresh:1}     Patient Profile:   Christian Schroeder is a 43 y.o. male with a hx of *** who is being seen 11/27/2023 for the evaluation of *** at the request of ***.  History of Present Illness:   Christian Schroeder ***   Past Medical History:  Diagnosis Date   Asthma    Atrial fibrillation [facility])    Essential hypertension 09/26/2017   Hypertension    Noncompliance 04/19/2021   Obesity (BMI 30-39.9) 04/24/2022   Snoring 04/24/2022    Past Surgical History:  Procedure Laterality Date   CARDIAC SURGERY     cardioversion     {Home Medications (Optional):21181}  Inpatient Medications: Scheduled Meds:  Continuous Infusions:  PRN Meds:   Allergies:    Allergies  Allergen Reactions   Latex Itching and Rash    Social History:   Social History   Socioeconomic History   Marital status: Single    Spouse name: Not on file   Number of children: Not on file   Years of education: Not on file   Highest education level: Not on file  Occupational History   Not on file  Tobacco Use   Smoking status: Light Smoker    Current packs/day: 0.00    Types: Cigarettes    Last attempt to quit: 05/30/2018    Years since quitting: 5.4   Smokeless tobacco: Current   Tobacco comments:    2 cigerettes a day  Vaping Use   Vaping status: Never Used  Substance and Sexual Activity   Alcohol use: Not Currently    Comment: occ   Drug use: Not Currently    Types: Marijuana   Sexual activity: Not on file  Other Topics Concern   Not on file  Social History Narrative   Not on file   Social Drivers of Health   Financial Resource Strain: Not on file  Food Insecurity: Not on file  Transportation Needs: Not on file   Physical Activity: Not on file  Stress: Not on file  Social Connections: Not on file  Intimate Partner Violence: Not on file    Family History:   *** Family History  Problem Relation Age of Onset   Kidney disease Mother    Hypertension Mother    Arrhythmia Father        died from abnormal heart rhythm   Hypertension Father    Kidney disease Brother      ROS:  Please see the history of present illness.  *** All other ROS reviewed and negative.     Physical Exam/Data:   Vitals:   11/26/23 2059 11/26/23 2100 11/26/23 2106 11/26/23 2153  BP:  (!) 162/114 (!) 161/115 (!) 156/109  Pulse:  87 79 74  Resp:    15  Temp:   97.6 F (36.4 C)   SpO2:  99%  100%  Weight: (!) 149.1 kg     Height: 6\' 5"  (1.956 m)      No intake or output data in the 24 hours ending 11/27/23 0001    11/26/2023    8:59 PM 11/14/2023    9:31 AM 03/24/2023   10:45 AM  Last 3 Weights  Weight (  lbs) 328 lb 11.3 oz 328 lb 11.2 oz 322 lb  Weight (kg) 149.1 kg 149.097 kg 146.058 kg     Body mass index is 38.98 kg/m.  General:  Well nourished, well developed, in no acute distress*** HEENT: normal Neck: no JVD Vascular: No carotid bruits; Distal pulses 2+ bilaterally Cardiac:  normal S1, S2; RRR; no murmur *** Lungs:  clear to auscultation bilaterally, no wheezing, rhonchi or rales  Abd: soft, nontender, no hepatomegaly  Ext: no edema Musculoskeletal:  No deformities, BUE and BLE strength normal and equal Skin: warm and dry  Neuro:  CNs 2-12 intact, no focal abnormalities noted Psych:  Normal affect   EKG:  The EKG was personally reviewed and demonstrates:  *** Telemetry:  Telemetry was personally reviewed and demonstrates:  ***  Relevant CV Studies: ***  Laboratory Data:  High Sensitivity Troponin:   Recent Labs  Lab 11/26/23 2105  TROPONINIHS 7     Chemistry Recent Labs  Lab 11/26/23 2105  NA 137  K 3.7  CL 102  CO2 26  GLUCOSE 94  BUN 11  CREATININE 1.08  CALCIUM 9.7   GFRNONAA >60  ANIONGAP 9    No results for input(s): "PROT", "ALBUMIN", "AST", "ALT", "ALKPHOS", "BILITOT" in the last 168 hours. Lipids No results for input(s): "CHOL", "TRIG", "HDL", "LABVLDL", "LDLCALC", "CHOLHDL" in the last 168 hours.  Hematology Recent Labs  Lab 11/26/23 2105  WBC 8.7  RBC 5.51  HGB 15.0  HCT 46.4  MCV 84.2  MCH 27.2  MCHC 32.3  RDW 14.5  PLT 380   Thyroid No results for input(s): "TSH", "FREET4" in the last 168 hours.  BNPNo results for input(s): "BNP", "PROBNP" in the last 168 hours.  DDimer No results for input(s): "DDIMER" in the last 168 hours.   Radiology/Studies:  DG Chest 2 View Result Date: 11/26/2023 CLINICAL DATA:  Chest pain. EXAM: CHEST - 2 VIEW COMPARISON:  07/16/2022 FINDINGS: The cardiomediastinal contours are normal. The lungs are clear. Pulmonary vasculature is normal. No consolidation, pleural effusion, or pneumothorax. No acute osseous abnormalities are seen. IMPRESSION: No active cardiopulmonary disease. Electronically Signed   By: Narda Rutherford M.D.   On: 11/26/2023 23:38     Assessment and Plan:   ***   Risk Assessment/Risk Scores:  {Complete the following score calculators/questions to meet required metrics.  Press F2         :[id number]}   {Is the patient being seen for unstable angina, ACS, NSTEMI or STEMI?[phone]} {Does this patient have CHF or CHF symptoms?      :[id number]} {Does this patient have ATRIAL FIBRILLATION?[phone]}  {Are we signing off today?:[id number]}  For questions or updates, please contact [facility] Please consult [email or web address] for contact info under    Signed, Karl Ito, MD  11/27/2023 12:01 AM

## 2023-11-28 LAB — URINE CULTURE: Culture: <10000 — AB

## 2023-12-05 ENCOUNTER — Ambulatory Visit (HOSPITAL_BASED_OUTPATIENT_CLINIC_OR_DEPARTMENT_OTHER): Admitting: Family

## 2023-12-05 ENCOUNTER — Encounter (HOSPITAL_BASED_OUTPATIENT_CLINIC_OR_DEPARTMENT_OTHER): Payer: Self-pay | Admitting: Family

## 2023-12-05 VITALS — BP 144/102 | HR 73 | Ht 77.0 in | Wt 332.5 lb

## 2023-12-05 DIAGNOSIS — I10 Essential (primary) hypertension: Secondary | ICD-10-CM

## 2023-12-05 DIAGNOSIS — R079 Chest pain, unspecified: Secondary | ICD-10-CM | POA: Diagnosis not present

## 2023-12-05 DIAGNOSIS — D6859 Other primary thrombophilia: Secondary | ICD-10-CM | POA: Diagnosis not present

## 2023-12-05 DIAGNOSIS — E669 Obesity, unspecified: Secondary | ICD-10-CM

## 2023-12-05 DIAGNOSIS — I5042 Chronic combined systolic (congestive) and diastolic (congestive) heart failure: Secondary | ICD-10-CM

## 2023-12-05 DIAGNOSIS — G4733 Obstructive sleep apnea (adult) (pediatric): Secondary | ICD-10-CM

## 2023-12-05 NOTE — Patient Instructions (Addendum)
 Medication Instructions:  May use Flonase  over the counter for nasal congestion. *If you need a refill on your cardiac medications before your next appointment, please call your pharmacy*  Follow-Up: At [facility], you and your health needs are our priority.  As part of our continuing mission to provide you with exceptional heart care, our providers are all part of one team.  This team includes your primary Cardiologist (physician) and Advanced Practice Providers or APPs (Physician Assistants and Nurse Practitioners) who all work together to provide you with the care you need, when you need it.  Please follow as scheduled   We recommend signing up for the patient portal called "MyChart".  Sign up information is provided on this After Visit Summary.  MyChart is used to connect with patients for Virtual Visits (Telemedicine).  Patients are able to view lab/test results, encounter notes, upcoming appointments, etc.  Non-urgent messages can be sent to your provider as well.   To learn more about what you can do with MyChart, go to [email or web address].   Other Instructions Let us  know if you would like to proceed with any further testing

## 2023-12-05 NOTE — Progress Notes (Unsigned)
 Cardiology Office Note:  .   Date:  12/05/2023  ID:  Christian Schroeder, DOB November 20, 1980, MRN [id number] PCP: Patient, No Pcp Per  [facility] Providers Cardiologist:  Maudine Sos, MD    History of Present Illness: .   Christian Schroeder is a 43 y.o. male  with a hx of atrial fibrillation, asthma, hypertension, obesity, tobacco use, OSA.    Initially found of atrial fibrillation and underwent TEE/DCCV at age 26.  Subsequently underwent ablation while living in [place] .  Awake 04/2017 with recurrent atrial fibrillation.  He underwent DCCV and follow-up in atrial fibrillation clinic 04/2017.  Echo LVEF 55 to 60%, mildly dilated left atrium.  He was started on diltiazem  and Eliquis .  He has previously canceled multiple sleep studies. Lisinopril  previously switched to Pranactin 25 mg daily.   Seen 08/21/22 with symptomatic atrial fibrillation. Toprol  50mg  daily initiated until cardioversion. However, on arrival for DCCV he was already in NSR. At visit 03/2023 he was experiencing musculoskeletal chest pain due to new exercise regimen but otherwise doing well.   Seen 11/14/23 with occasional chest burning different than heartburn but exercising without symptoms. EKG with slight progression in inferior TWI. Discussed cardiac CTA, he preferred monitoring. He was awaiting scheduling of CPAP titration after moderate OSA by home study. ED visit 11/26/23 with chest pain. He contacted the office 11/27/23 noting elevated BP, Diltiazem  increased to 180mg  daily.   Presents today for follow up. Describes chest pain at rest or with activity as sharp. No clear relieving nor aggravating factors. Reports he went to ED as chest pain was more severe than prior episodes. BP remains >130/80. Typical presentation of angina reviewed. Modalities of ischemic evaluation including coronary calcium score vs cardiac CTA reviewed. 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   ROS: Please see the history of present illness.     All other systems reviewed and are negative.   Studies Reviewed: .           Risk Assessment/Calculations:     CHA2DS2-VASc Score = 1   This indicates a 0.6% annual risk of stroke. The patient's score is based upon: CHF History: 0 HTN History: 1 Diabetes History: 0 Stroke History: 0 Vascular Disease History: 0 Age Score: 0 Gender Score: 0          STOP-Bang Score:         Physical Exam:   VS:  BP (!) 144/102   Pulse 73   Ht 6\' 5"  (1.956 m)   Wt (!) 332 lb 8 oz (150.8 kg)   SpO2 99%   BMI 39.43 kg/m    Wt Readings from Last 3 Encounters:  12/05/23 (!) 332 lb 8 oz (150.8 kg)  11/26/23 (!) 328 lb 11.3 oz (149.1 kg)  11/14/23 (!) 328 lb 11.2 oz (149.1 kg)     GEN: Well nourished, overweight, well developed in no acute distress NECK: No JVD; No carotid bruits CARDIAC: RRR, no murmurs, rubs, gallops RESPIRATORY:  Clear to auscultation without rales, wheezing or rhonchi  ABDOMEN: Soft, non-tender, non-distended EXTREMITIES:  No edema; No deformity   ASSESSMENT AND PLAN: .    Abnormal EKG / Chest burning - Recent ED visit chest pain. EKG known lateral TWI related to LVH and inferior TWI. Given repeated episodes of chest pain with mixed typical and atypical features, reviewed coronary calcium score versus cardiac CTA. Politely declines to schedule today, given additional information to review.   PAF/hypercoagulable state- Denies palpitations. Continue flecainide  50 mg BID  and diltiazem  190 mg daily. Previously offered referral to EP for consideration of alternate AAD which he will consider.   Hypertension- BP elevated. Diltiazem  increased to 180mg  daily <1 week ago. Continue spironolactone  25 mg daily. Previously hesitant regarding medication changes. Discussed to monitor BP at home at least 2 hours after medications and sitting for 5-10 minutes. If BP persistently elevated, further increase Diltiazem  as HR permits.    Obesity- Weight loss via diet and exercise  encouraged. Discussed the impact being overweight would have on cardiovascular risk.   OSA - Awaiting scheduling of CPAP titration. Encouraged trial of CPAP machine with some hesitancy from patient.   Tobacco use - Cessation education provided. Smoking cessation encouraged.        Dispo: follow up as scheduled  Signed, Clearnce Curia, NP

## 2023-12-07 ENCOUNTER — Encounter (HOSPITAL_BASED_OUTPATIENT_CLINIC_OR_DEPARTMENT_OTHER): Payer: Self-pay | Admitting: Family

## 2024-01-05 ENCOUNTER — Other Ambulatory Visit (HOSPITAL_BASED_OUTPATIENT_CLINIC_OR_DEPARTMENT_OTHER): Payer: Self-pay | Admitting: Family

## 2024-01-05 DIAGNOSIS — I480 Paroxysmal atrial fibrillation: Secondary | ICD-10-CM

## 2024-01-09 ENCOUNTER — Encounter: Payer: Self-pay | Admitting: Family Medicine

## 2024-01-09 ENCOUNTER — Ambulatory Visit: Payer: Self-pay | Admitting: Family Medicine

## 2024-01-09 VITALS — BP 160/120 | HR 78 | Temp 98.2°F | Ht 77.0 in | Wt 331.4 lb

## 2024-01-09 DIAGNOSIS — D6869 Other thrombophilia: Secondary | ICD-10-CM | POA: Diagnosis not present

## 2024-01-09 DIAGNOSIS — M79642 Pain in left hand: Secondary | ICD-10-CM | POA: Diagnosis not present

## 2024-01-09 DIAGNOSIS — I10 Essential (primary) hypertension: Secondary | ICD-10-CM

## 2024-01-09 DIAGNOSIS — Z7689 Persons encountering health services in other specified circumstances: Secondary | ICD-10-CM

## 2024-01-09 DIAGNOSIS — I480 Paroxysmal atrial fibrillation: Secondary | ICD-10-CM

## 2024-01-09 DIAGNOSIS — R7989 Other specified abnormal findings of blood chemistry: Secondary | ICD-10-CM

## 2024-01-09 DIAGNOSIS — E66812 Obesity, class 2: Secondary | ICD-10-CM

## 2024-01-09 DIAGNOSIS — Z6839 Body mass index (BMI) 39.0-39.9, adult: Secondary | ICD-10-CM

## 2024-01-09 NOTE — Progress Notes (Unsigned)
 I,Jameka J Llittleton, CMA,acting as a Neurosurgeon for [employer], NP.,have documented all relevant documentation on the behalf of Christian Spry, NP,as directed by  Christian Spry, NP while in the presence of Christian Spry, NP.  Subjective:  Patient ID: Christian Schroeder , male    DOB: 06/16/1981 , 43 y.o.   MRN: [id number]  No chief complaint on file.   HPI  Patient is 43 year male presents today to establish care. Patient states that he has a diagnosis of parosymial atrial fibrillation, hypertension and hyperthyroidism but he has not been taking his medications.     Past Medical History:  Diagnosis Date   Asthma    Atrial fibrillation (HCC)    Essential hypertension 09/26/2017   Hypertension    Noncompliance 04/19/2021   Obesity (BMI 30-39.9) 04/24/2022   Snoring 04/24/2022     Family History  Problem Relation Age of Onset   Kidney disease Mother    Hypertension Mother    Arrhythmia Father        died from abnormal heart rhythm   Hypertension Father    Kidney disease Brother      Current Outpatient Medications:    apixaban  (ELIQUIS ) 5 MG TABS tablet, Take 1 tablet (5 mg total) by mouth 2 (two) times daily., Disp: 180 tablet, Rfl: 2   diltiazem  (CARDIZEM  CD) 180 MG 24 hr capsule, Take 1 capsule (180 mg total) by mouth daily., Disp: 30 capsule, Rfl: 11   flecainide  (TAMBOCOR ) 50 MG tablet, Take 1 tablet by mouth twice daily, Disp: 180 tablet, Rfl: 3   spironolactone  (ALDACTONE ) 25 MG tablet, Take 1 tablet (25 mg total) by mouth daily., Disp: 90 tablet, Rfl: 2   Allergies  Allergen Reactions   Latex Itching and Rash     Review of Systems   There were no vitals filed for this visit. There is no height or weight on file to calculate BMI.  Wt Readings from Last 3 Encounters:  12/05/23 (!) 332 lb 8 oz (150.8 kg)  11/26/23 (!) 328 lb 11.3 oz (149.1 kg)  11/14/23 (!) 328 lb 11.2 oz (149.1 kg)    The ASCVD Risk score (Arnett DK, et al., 2019) failed to calculate for the following reasons:    Cannot find a previous HDL lab   Cannot find a previous total cholesterol lab  Objective:  Physical Exam      Assessment And Plan:  Establishing care with new doctor, encounter for  Essential hypertension  Paroxysmal atrial fibrillation (HCC)    No follow-ups on file.  Patient was given opportunity to ask questions. Patient verbalized understanding of the plan and was able to repeat key elements of the plan. All questions were answered to their satisfaction.    I, Christian Spry, NP, have reviewed all documentation for this visit. The documentation on 01/09/24 for the exam, diagnosis, procedures, and orders are all accurate and complete.   IF YOU HAVE BEEN REFERRED TO A SPECIALIST, IT MAY TAKE 1-2 WEEKS TO SCHEDULE/PROCESS THE REFERRAL. IF YOU HAVE NOT HEARD FROM US /SPECIALIST IN TWO WEEKS, PLEASE GIVE US  A CALL AT [phone].

## 2024-01-13 ENCOUNTER — Other Ambulatory Visit

## 2024-01-22 DIAGNOSIS — M79642 Pain in left hand: Secondary | ICD-10-CM | POA: Insufficient documentation

## 2024-01-22 DIAGNOSIS — R7989 Other specified abnormal findings of blood chemistry: Secondary | ICD-10-CM | POA: Insufficient documentation

## 2024-01-22 DIAGNOSIS — D6869 Other thrombophilia: Secondary | ICD-10-CM | POA: Insufficient documentation

## 2024-01-22 DIAGNOSIS — Z7689 Persons encountering health services in other specified circumstances: Secondary | ICD-10-CM | POA: Insufficient documentation

## 2024-01-22 NOTE — Assessment & Plan Note (Signed)
 Chronic. On diltiazem  180 mg every day, flecainide  50 mg every day.

## 2024-01-22 NOTE — Assessment & Plan Note (Signed)
 On eliquis  5 mg twice daily d/t PAF

## 2024-01-22 NOTE — Assessment & Plan Note (Signed)
 He is encouraged to strive for BMI less than 30 to decrease cardiac risk. Advised to aim for at least 150 minutes of exercise per week.

## 2024-01-22 NOTE — Assessment & Plan Note (Signed)
 Low salt diet advised; followed by Cardiology Dr Theodis Fiscal.

## 2024-01-23 ENCOUNTER — Other Ambulatory Visit (HOSPITAL_BASED_OUTPATIENT_CLINIC_OR_DEPARTMENT_OTHER): Payer: Self-pay | Admitting: Family

## 2024-01-23 DIAGNOSIS — I10 Essential (primary) hypertension: Secondary | ICD-10-CM

## 2024-01-26 ENCOUNTER — Other Ambulatory Visit: Payer: Self-pay

## 2024-02-12 ENCOUNTER — Other Ambulatory Visit (HOSPITAL_BASED_OUTPATIENT_CLINIC_OR_DEPARTMENT_OTHER): Payer: Self-pay | Admitting: Family

## 2024-02-12 DIAGNOSIS — I480 Paroxysmal atrial fibrillation: Secondary | ICD-10-CM

## 2024-02-12 DIAGNOSIS — D6859 Other primary thrombophilia: Secondary | ICD-10-CM

## 2024-02-13 ENCOUNTER — Encounter (HOSPITAL_BASED_OUTPATIENT_CLINIC_OR_DEPARTMENT_OTHER): Payer: Self-pay | Admitting: Cardiovascular Disease

## 2024-02-13 NOTE — Telephone Encounter (Signed)
 Prescription refill request for Eliquis  received. Indication:afib Last office visit:4/25 Scr:1.08  4/25 Age: 43 Weight:150.3  kg  Prescription refilled

## 2024-05-06 ENCOUNTER — Other Ambulatory Visit (HOSPITAL_BASED_OUTPATIENT_CLINIC_OR_DEPARTMENT_OTHER): Payer: Self-pay | Admitting: Family

## 2024-05-06 DIAGNOSIS — I480 Paroxysmal atrial fibrillation: Secondary | ICD-10-CM

## 2024-05-06 DIAGNOSIS — I10 Essential (primary) hypertension: Secondary | ICD-10-CM

## 2024-05-07 ENCOUNTER — Telehealth: Payer: Self-pay | Admitting: Cardiovascular Disease

## 2024-05-07 DIAGNOSIS — I480 Paroxysmal atrial fibrillation: Secondary | ICD-10-CM

## 2024-05-07 DIAGNOSIS — I10 Essential (primary) hypertension: Secondary | ICD-10-CM

## 2024-05-07 MED ORDER — DILTIAZEM HCL ER COATED BEADS 180 MG PO CP24
180.0000 mg | ORAL_CAPSULE | Freq: Every day | ORAL | 2 refills | Status: AC
Start: 2024-05-07 — End: ?

## 2024-05-07 NOTE — Telephone Encounter (Signed)
 *  STAT* If patient is at the pharmacy, call can be transferred to refill team.   1. Which medications need to be refilled? (please list name of each medication and dose if known)   diltiazem  (CARDIZEM  CD) 180 MG 24 hr capsule     2. Would you like to learn more about the convenience, safety, & potential cost savings by using the [facility]? No      3. Are you open to using the [facility] (Type [facility]. ).no    4. Which pharmacy/location (including street and city if local pharmacy) is medication to be sent to? [id number] - [place], [place] - [address]    5. Do they need a 30 day or 90 day supply? 90 days

## 2024-05-07 NOTE — Telephone Encounter (Signed)
 Pt's medication was sent to pt's pharmacy as requested. Confirmation received.

## 2024-05-11 ENCOUNTER — Encounter: Payer: Self-pay | Admitting: *Deleted

## 2024-05-12 ENCOUNTER — Telehealth (HOSPITAL_BASED_OUTPATIENT_CLINIC_OR_DEPARTMENT_OTHER): Payer: Self-pay | Admitting: *Deleted

## 2024-05-12 ENCOUNTER — Ambulatory Visit (INDEPENDENT_AMBULATORY_CARE_PROVIDER_SITE_OTHER): Admitting: Cardiovascular Disease

## 2024-05-12 ENCOUNTER — Encounter (HOSPITAL_BASED_OUTPATIENT_CLINIC_OR_DEPARTMENT_OTHER): Payer: Self-pay | Admitting: Cardiovascular Disease

## 2024-05-12 VITALS — BP 162/100 | HR 88 | Ht 77.0 in | Wt 331.8 lb

## 2024-05-12 DIAGNOSIS — Z5181 Encounter for therapeutic drug level monitoring: Secondary | ICD-10-CM

## 2024-05-12 DIAGNOSIS — I10 Essential (primary) hypertension: Secondary | ICD-10-CM | POA: Diagnosis not present

## 2024-05-12 MED ORDER — SPIRONOLACTONE 50 MG PO TABS: 50.0000 mg | ORAL_TABLET | Freq: Every day | ORAL | 3 refills | Status: AC

## 2024-05-12 NOTE — Telephone Encounter (Signed)
**Note De-Identified Abir Craine Obfuscation** I started a CPAP Titration PA through the [facility] provider portal and it is currently pending. Certification Number: [id number]

## 2024-05-12 NOTE — Telephone Encounter (Signed)
 Patient was seen in office today by Dr Raford, was asking about sleep study   Christian Schroeder, Brandie C, [facility]   07/23/23  1:54 PM Note Notified patient of sleep study results and recommendations. All questions were answered and patient verbalized understanding. CPAP Titration ordered today.     Will forward to sleep team to follow up

## 2024-05-12 NOTE — Telephone Encounter (Signed)
 Macario sent secure chat she found the patients insurance information

## 2024-05-12 NOTE — Telephone Encounter (Signed)
**Note De-Identified Usbaldo Pannone Obfuscation** We have no current insurance information on file for the pt so I called him to get his information. The pt stated you need to look harder because you guys have had my insurance information for years and that the [facility] office did not question him at all about his card at todays office visit.  The pt refused to give me his insurance information and requested you reach out to Dr Skeeter office as there is obviously a problem on yall's end.  I am forwarding this call to Dr Skeeter nurse to request the pts insurance information so I can do this CPAP Titration.

## 2024-05-12 NOTE — Patient Instructions (Signed)
 Medication Instructions:  INCREASE SPIRONOLACTONE  TO 50 MG DAILY   *If you need a refill on your cardiac medications before your next appointment, please call your pharmacy*  Lab Work: FASTING LP/CMET 1 WEEK AFTER MEDICATION CHANGE   If you have labs (blood work) drawn today and your tests are completely normal, you will receive your results only by: MyChart Message (if you have MyChart) OR A paper copy in the mail If you have any lab test that is abnormal or we need to change your treatment, we will call you to review the results.  Testing/Procedures: NONE   Follow-Up: At [facility], you and your health needs are our priority.  As part of our continuing mission to provide you with exceptional heart care, our providers are all part of one team.  This team includes your primary Cardiologist (physician) and Advanced Practice Providers or APPs (Physician Assistants and Nurse Practitioners) who all work together to provide you with the care you need, when you need it.  Your next appointment:   3 month(s)  Provider:   Annabella Scarce, MD, Rosaline Bane, NP, or Reche Finder, NP     We recommend signing up for the patient portal called MyChart.  Sign up information is provided on this After Visit Summary.  MyChart is used to connect with patients for Virtual Visits (Telemedicine).  Patients are able to view lab/test results, encounter notes, upcoming appointments, etc.  Non-urgent messages can be sent to your provider as well.   To learn more about what you can do with MyChart, go to [email or web address].   Other Instructions HAVE SENT MESSAGE TO SLEEP TEAM, LET US  KNOW IF YOU DO NOT HEAR FROM THEM IN 2 WEEKS

## 2024-05-12 NOTE — Progress Notes (Signed)
 Cardiology Office Note:  .   Date:  05/16/2024  ID:  Christian Schroeder, DOB April 09, 1981, MRN [id number] PCP: Patient, No Pcp Per  [facility] Providers Cardiologist:  Annabella Scarce, MD    History of Present Illness: .   Christian Schroeder is a 43 y.o. male with a hx of hypertension, PAF, OSA, prior tobacco abuse, depression, and asthma here for follow up.  He has a history of atrial fibrillation and underwent TEE/DCCV at age 30.  He subsequently underwent ablation while living in [place] .  He was awakened from sleep 04/2017 with recurrent atrial fibrillation.  He was seen in the ED and his heart rate was 193 bpm.  When in atrial fibrillation he felt like his heart was racing and it was difficult to breathe.  He underwent DCCV and followed up in atrial fibrillation clinic 04/2017.  He was started on diltiazem  and given a 30-day prescription of Eliquis .  He had an echo 04/2017 that revealed LVEF 55-60% and mildly dilated left atrium.  He was also referred for sleep study but has not yet had it.  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  He cancelled the sleep study 3 times due to work obligations.   Christian Schroeder quit smoking. At his visit 11/2021 BP was uncontrolled and he wanted to work on diet and exercise.  He reported one episode of Afib in the prior year which prompted an ED visit. He noted that he had been cutting his diltiazem  in half. Lisinopril  was switched to spironolactone  25 mg daily.  He was seen with symptomatic afib 08/2022.  He was started on metoprolol  and converted to NSR prior to DCCV.  He was seen 10/2023 and reported chest burning.  Coronary CT-A was recommended and he declined.  BP was elevated and diltiazem  was increased.     Discussed the use of AI scribe software for clinical note transcription with the patient, who gave verbal consent to proceed.  History of Present Illness Christian Schroeder has not experienced recent episodes of atrial fibrillation and is currently  managed with diltiazem , flecainide , and Eliquis  to prevent strokes. 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 No chest pain or shortness of breath.  He monitors his blood pressure at home, noting it typically ranges in the upper 130s over the same diastolic number, though it was 143/83 this morning. 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 He is currently taking spironolactone  for blood pressure management.  His diet includes a lot of home-cooked meals with chicken, fish, beef, rice, and beans, but he consumes fast food when busy. He tries to incorporate fruits and vegetables through juicing, though he only drinks about four to five ounces a day. His diet is high in salt and cheese, and he is working on Engineer, materials.  He has completed a sleep study and recalls being told that he did not need a CPAP machine. He continues to work on improving his lifestyle to manage his health conditions better.  He is very active, engaging in physical work and walking three times a week. He has children aged two to nine and finds balancing work and family life stressful. He reports knee and back pain during physical activities.  ROS:  As per HPI  Studies Reviewed: .  Echo 05/09/17: Study Conclusions   - Left ventricle: GLSS is normal at -20% The cavity size was    normal. There was moderate concentric hypertrophy. Systolic    function was normal. The estimated ejection fraction was in the    range of 55% to 60%. Wall motion was normal; there were no    regional wall motion abnormalities. Left ventricular diastolic    function parameters were normal.  - Left atrium: The atrium was mildly dilated    Risk Assessment/Calculations:                                                                                                                                               Physical Exam:   VS:  BP (!) 162/100   Pulse 88   Ht 6' 5 (1.956 m)   Wt (!) 331 lb 12.8 oz (150.5 kg)   SpO2 95%   BMI 39.35 kg/m  , BMI Body mass index is 39.35 kg/m. GENERAL:  Well appearing HEENT: Pupils equal round and reactive, fundi not visualized, oral mucosa unremarkable NECK:  No jugular venous distention, waveform within normal limits, carotid upstroke brisk and symmetric, no bruits, no thyromegaly LUNGS:  Clear to auscultation bilaterally HEART:  RRR.  PMI not displaced or sustained,S1 and S2 within normal limits, no S3, no S4, no clicks, no rubs, no murmurs ABD:  Flat, positive bowel sounds normal in frequency in pitch, no bruits, no rebound, no guarding, no midline pulsatile mass, no hepatomegaly, no splenomegaly EXT:  2 plus pulses throughout, no edema, no cyanosis no clubbing SKIN:  No rashes no nodules NEURO:  Cranial nerves II through XII grossly intact, motor grossly intact throughout PSYCH:  Cognitively intact, oriented to person place and time   ASSESSMENT AND PLAN: .    Assessment & Plan # Paroxysmal Atrial fibrillation No recent episodes. On diltiazem , flecainide , and Eliquis  for prevention.  # Hypertension Suboptimal control with home readings in 130s/80s, office at 154/102. - Increase spironolactone  to 50 mg daily. - Monitor blood pressure at home regularly. - Schedule follow-up in 2-3 months. - Perform BMP IN  one week to check potassium levels.  # Hypokalemia Previously low potassium, currently on spironolactone . - Check potassium levels in one week.  # Hyperlipidemia Cholesterol levels need assessment. Prefers lifestyle modifications. - Order lipid panel in one week.  # Obstructive sleep apnea Evaluation pending final report. Technician noted signs, not severe enough for CPAP. - Follow up with sleep lab for finalized report.         Dispo: F/U 3 MONTHS  Signed, Annabella Scarce, MD

## 2024-05-14 ENCOUNTER — Ambulatory Visit: Admitting: Family Medicine

## 2024-05-16 ENCOUNTER — Encounter (HOSPITAL_BASED_OUTPATIENT_CLINIC_OR_DEPARTMENT_OTHER): Payer: Self-pay | Admitting: Cardiovascular Disease

## 2024-05-20 NOTE — Telephone Encounter (Signed)
 OK thank you.  TCR

## 2024-05-21 NOTE — Telephone Encounter (Addendum)
**Note De-Identified Christian Schroeder Obfuscation** Letter received from [facility] stating that they have approved this CPAP Titration from 06/19/2024-07/19/2024. Authorization #: [id number]  I have transferred the order to the sleep lab with a message asking them to call the pt to schedule his CPAP as his approval only lasts for 1 month.  I called the pt and he stated that he has already done a CPAP Titration at the sleep lab. I advised him that we do not have results from a CPAP Titration on file.  He stated that he was advised by a bald headed black technician that his s/s were not severe enough for a CPAP.   While on the phone with the pt, I checked Dr Skeeter notes from the pts  9/24 office visit. She noted: Obstructive sleep apnea Evaluation pending final report. Technician noted signs, not severe enough for CPAP. - Follow up with sleep lab for finalized report.  I advised the pt that someone will be calling him back to discuss if he needs a CPAP Titration or not.

## 2024-05-21 NOTE — Telephone Encounter (Signed)
**Note De-Identified Christian Schroeder Obfuscation** After reviewing the pts chart I called him back and advised him that there has been a mistake made as per his NPSG result he stopped breathing 28.7/per hour during the study and his oxygen dropped to 74%.  I called the pt back and explained all of this to him and he stated Im not a doctor, yall are and I was only telling yall what the technician told me.  I asked him if he is willing to do the CPAP Titration and he stated that he will. I offered him the sleep labs phone number so he can call them to be scheduled as he was only granted a 1 month approval from his insurance plan and he requested that I send it to him Christian Schroeder a [facility] message.  I have sent the [facility] message to him.

## 2024-06-27 ENCOUNTER — Encounter (HOSPITAL_BASED_OUTPATIENT_CLINIC_OR_DEPARTMENT_OTHER): Payer: Self-pay | Admitting: Cardiovascular Disease

## 2024-06-30 ENCOUNTER — Encounter (HOSPITAL_BASED_OUTPATIENT_CLINIC_OR_DEPARTMENT_OTHER): Admitting: Cardiology

## 2024-07-27 ENCOUNTER — Encounter (HOSPITAL_BASED_OUTPATIENT_CLINIC_OR_DEPARTMENT_OTHER): Payer: Self-pay

## 2024-08-10 ENCOUNTER — Other Ambulatory Visit (HOSPITAL_BASED_OUTPATIENT_CLINIC_OR_DEPARTMENT_OTHER): Payer: Self-pay | Admitting: Family

## 2024-08-10 DIAGNOSIS — I480 Paroxysmal atrial fibrillation: Secondary | ICD-10-CM

## 2024-08-10 DIAGNOSIS — D6859 Other primary thrombophilia: Secondary | ICD-10-CM

## 2024-08-10 NOTE — Telephone Encounter (Signed)
 Pt last saw Dr Raford 05/12/24, last labs 11/26/23 Creat 1.08, age 43, weight 150.5kg, based on specified criteria pt is on appropriate dosage of Eliquis  5mg  BID for afib.  Will refill rx.

## 2024-08-16 ENCOUNTER — Ambulatory Visit (HOSPITAL_BASED_OUTPATIENT_CLINIC_OR_DEPARTMENT_OTHER): Admitting: Family

## 2024-08-16 NOTE — Progress Notes (Deleted)
 " Cardiology Office Note:  .   Date:  08/16/2024  ID:  Christian Schroeder, DOB 08-01-1981, MRN [id number] PCP: Patient, No Pcp Per  [facility] Providers Cardiologist:  Annabella Scarce, MD    History of Present Illness: .   [name] is a 43 y.o. male  with a hx of atrial fibrillation, asthma, hypertension, obesity, tobacco use, OSA.    Initially found of atrial fibrillation and underwent TEE/DCCV at age 38.  Subsequently underwent ablation while living in [place] .  Awake 04/2017 with recurrent atrial fibrillation.  He underwent DCCV and follow-up in atrial fibrillation clinic 04/2017.  Echo LVEF 55 to 60%, mildly dilated left atrium.  He was started on diltiazem  and Eliquis .  He has previously canceled multiple sleep studies. Lisinopril  previously switched to Pranactin 25 mg daily.   Seen 08/21/22 with symptomatic atrial fibrillation. Toprol  50mg  daily initiated until cardioversion. However, on arrival for DCCV he was already in NSR. At visit 03/2023 he was experiencing musculoskeletal chest pain due to new exercise regimen but otherwise doing well.   Seen 11/14/23 with occasional chest burning different than heartburn but exercising without symptoms. EKG with slight progression in inferior TWI. Discussed cardiac CTA, he preferred monitoring. He was awaiting scheduling of CPAP titration after moderate OSA by home study. ED visit 11/26/23 with chest pain. He contacted the office 11/27/23 noting elevated BP, Diltiazem  increased to 180mg  daily.   Seen 12/05/23 with chest pain with mixed typical and atypical features and EKG with stable later TWI and inferior TWI. Calcium score vs CTA ordered, he declined. Diltiazem  increased for BP control.   At visit 05/12/24 he reported no recent PAF. BP not at goal, Spironolactone  increased to 50mg  daily. He preferred lifestyle modifications for cholesterol.   Presents today for follow up. 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 ***  ROS: Please see  the history of present illness.    All other systems reviewed and are negative.   Studies Reviewed: .       ***    Risk Assessment/Calculations:     CHA2DS2-VASc Score = 1   This indicates a 0.6% annual risk of stroke. The patient's score is based upon: CHF History: 0 HTN History: 1 Diabetes History: 0 Stroke History: 0 Vascular Disease History: 0 Age Score: 0 Gender Score: 0          STOP-Bang Score:     { Consider Dx Sleep Disordered Breathing or Sleep Apnea  ICD G47.33          :1}    Physical Exam:   VS:  There were no vitals taken for this visit.   Wt Readings from Last 3 Encounters:  05/12/24 (!) 331 lb 12.8 oz (150.5 kg)  01/09/24 (!) 331 lb 6.4 oz (150.3 kg)  12/05/23 (!) 332 lb 8 oz (150.8 kg)     GEN: Well nourished, overweight, well developed in no acute distress NECK: No JVD; No carotid bruits CARDIAC: RRR, no murmurs, rubs, gallops RESPIRATORY:  Clear to auscultation without rales, wheezing or rhonchi  ABDOMEN: Soft, non-tender, non-distended EXTREMITIES:  No edema; No deformity   ASSESSMENT AND PLAN: .    PAF/hypercoagulable state- ***Previously offered referral to EP for consideration of alternate AAD which he will consider.   Hypertension-***   Obesity- ***  OSA - ***Cancelled CPAP titration 06/2024, not yet rescheduled.   Tobacco use - ***       Dispo: follow up ***  Signed, Reche GORMAN Finder, NP   "
